# Patient Record
Sex: Male | Born: 1960 | Race: Black or African American | Hispanic: No | Marital: Single | Smoking: Current every day smoker
Health system: Southern US, Community
[De-identification: ages and names within clinical notes are randomized; demographics above are authoritative.]

## PROBLEM LIST (undated history)

## (undated) HISTORY — PX: HERNIA REPAIR: SHX51

---

## 1998-03-01 ENCOUNTER — Emergency Department (HOSPITAL_COMMUNITY): Admission: EM | Admit: 1998-03-01 | Discharge: 1998-03-01 | Payer: Self-pay | Admitting: Emergency Medicine

## 1999-07-03 ENCOUNTER — Emergency Department (HOSPITAL_COMMUNITY): Admission: EM | Admit: 1999-07-03 | Discharge: 1999-07-03 | Payer: Self-pay | Admitting: *Deleted

## 1999-07-27 ENCOUNTER — Emergency Department (HOSPITAL_COMMUNITY): Admission: EM | Admit: 1999-07-27 | Discharge: 1999-07-27 | Payer: Self-pay | Admitting: *Deleted

## 2008-02-28 ENCOUNTER — Emergency Department (HOSPITAL_COMMUNITY): Admission: EM | Admit: 2008-02-28 | Discharge: 2008-02-28 | Payer: Self-pay | Admitting: Emergency Medicine

## 2008-03-09 ENCOUNTER — Emergency Department (HOSPITAL_COMMUNITY): Admission: EM | Admit: 2008-03-09 | Discharge: 2008-03-09 | Payer: Self-pay | Admitting: Family Medicine

## 2008-04-12 ENCOUNTER — Emergency Department (HOSPITAL_COMMUNITY): Admission: EM | Admit: 2008-04-12 | Discharge: 2008-04-12 | Payer: Self-pay | Admitting: Emergency Medicine

## 2014-05-17 ENCOUNTER — Emergency Department (HOSPITAL_COMMUNITY): Payer: Self-pay

## 2014-05-17 ENCOUNTER — Encounter (HOSPITAL_COMMUNITY): Payer: Self-pay | Admitting: Emergency Medicine

## 2014-05-17 ENCOUNTER — Emergency Department (HOSPITAL_COMMUNITY)
Admission: EM | Admit: 2014-05-17 | Discharge: 2014-05-17 | Disposition: A | Discharge status: Home / Self Care | Payer: Self-pay | Attending: Emergency Medicine | Admitting: Emergency Medicine

## 2014-05-17 DIAGNOSIS — Z720 Tobacco use: Secondary | ICD-10-CM | POA: Insufficient documentation

## 2014-05-17 DIAGNOSIS — Z23 Encounter for immunization: Secondary | ICD-10-CM | POA: Insufficient documentation

## 2014-05-17 DIAGNOSIS — Y9289 Other specified places as the place of occurrence of the external cause: Secondary | ICD-10-CM | POA: Insufficient documentation

## 2014-05-17 DIAGNOSIS — S91311A Laceration without foreign body, right foot, initial encounter: Secondary | ICD-10-CM | POA: Insufficient documentation

## 2014-05-17 DIAGNOSIS — Y9389 Activity, other specified: Secondary | ICD-10-CM | POA: Insufficient documentation

## 2014-05-17 DIAGNOSIS — W293XXA Contact with powered garden and outdoor hand tools and machinery, initial encounter: Secondary | ICD-10-CM | POA: Insufficient documentation

## 2014-05-17 MED ORDER — LIDOCAINE-EPINEPHRINE (PF) 2 %-1:200000 IJ SOLN
20.0000 mL | Freq: Once | INTRAMUSCULAR | Status: AC
  Administered 2014-05-17: 10 mL via INTRADERMAL
  Filled 2014-05-17: qty 20

## 2014-05-17 MED ORDER — CEPHALEXIN 500 MG PO CAPS: 500.0000 mg | ORAL_CAPSULE | Freq: Four times a day (QID) | ORAL | Status: AC

## 2014-05-17 MED ORDER — OXYCODONE-ACETAMINOPHEN 5-325 MG PO TABS
1.0000 | ORAL_TABLET | Freq: Once | ORAL | Status: AC
  Administered 2014-05-17: 1 via ORAL
  Filled 2014-05-17: qty 1

## 2014-05-17 MED ORDER — TETANUS-DIPHTH-ACELL PERTUSSIS 5-2.5-18.5 LF-MCG/0.5 IM SUSP
0.5000 mL | Freq: Once | INTRAMUSCULAR | Status: AC
  Administered 2014-05-17: 0.5 mL via INTRAMUSCULAR
  Filled 2014-05-17: qty 0.5

## 2014-05-17 NOTE — ED Notes (Signed)
Patient states cut his R foot with chainsaw.   Top of R foot.   Bleeding controlled and wrapped with gauze in triage.   Patient warm in extremity and has good pulse.

## 2014-05-17 NOTE — ED Notes (Signed)
PA at the bedside attempting to suture patient.

## 2014-05-17 NOTE — ED Provider Notes (Signed)
CSN: [id number]     Arrival date & time 05/17/14  1054 History   First MD Initiated Contact with Patient 05/17/14 1121     Chief Complaint  Patient presents with  . Extremity Laceration     (Consider location/radiation/quality/duration/timing/severity/associated sxs/prior Treatment) HPI Comments: Pt states that he cut the top of his right foot with a chainsaw a short time ago. Pt states that the chainsaw cut thru his boot. Denies problems with movement or sensation. No previous injury to the area. Unsure of last tetanus. Able to ambulate without any problem  The history is provided by the patient. No language interpreter was used.    History reviewed. No pertinent past medical history. Past Surgical History  Procedure Laterality Date  . Hernia repair     No family history on file. History  Substance Use Topics  . Smoking status: Current Every Day Smoker  . Smokeless tobacco: Not on file  . Alcohol Use: Yes    Review of Systems  All other systems reviewed and are negative.     Allergies  Review of patient's allergies indicates no known allergies.  Home Medications   Prior to Admission medications   Medication Sig Start Date End Date Taking? Authorizing Provider  Ranitidine HCl (ZANTAC PO) Take 1 tablet by mouth once as needed (for reflux).   Yes Historical Provider, MD   BP 121/70  Pulse 47  Temp(Src) 98.2 F (36.8 C) (Oral)  Resp 17  Ht 5\' 6"  (1.676 m)  Wt 170 lb (77.111 kg)  BMI 27.45 kg/m2  SpO2 93% Physical Exam  Nursing note and vitals reviewed. Constitutional: He is oriented to person, place, and time. He appears well-developed and well-nourished.  Cardiovascular: Normal rate and regular rhythm.   Pulmonary/Chest: Effort normal and breath sounds normal.  Musculoskeletal: Normal range of motion.  Neurological: He is alert and oriented to person, place, and time. He exhibits normal muscle tone. Coordination normal.  Skin:  Laceration to the top of the  right foot. Pulses intact. Moving toes,foot and ankle without any problem.    ED Course  LACERATION REPAIR Date/Time: 05/17/2014 1:25 PM Performed by: Glendell Docker Authorized by: Glendell Docker Consent: Verbal consent obtained. Risks and benefits: risks, benefits and alternatives were discussed Consent given by: patient Patient identity confirmed: verbally with patient Body area: lower extremity Location details: right foot Laceration length: 5 cm Foreign bodies: no foreign bodies Anesthesia: local infiltration Local anesthetic: lidocaine 2% with epinephrine Irrigation solution: saline Irrigation method: tap Amount of cleaning: standard Skin closure: 3-0 Prolene Number of sutures: 5 Technique: complex and simple Approximation: close Patient tolerance: Patient tolerated the procedure well with no immediate complications.   (including critical care time) Labs Review Labs Reviewed - No data to display  Imaging Review Dg Foot Complete Right  05/17/2014   CLINICAL DATA:  Initial encounter for laceration across the dorsal foot with a chainsaw.  EXAM: RIGHT FOOT COMPLETE - 3+ VIEW  COMPARISON:  None.  FINDINGS: A soft tissue laceration and swelling is noted across the dorsal aspect of the midfoot. There is no underlying fracture or osseous injury. No radiopaque foreign body is evident. The distal foot is intact.  IMPRESSION: Soft tissue laceration and swelling over the dorsal midfoot without an underlying fracture or osseous injury.   Electronically Signed   By: Lawrence Santiago M.D.   On: 05/17/2014 12:03     EKG Interpretation None      MDM   Final diagnoses:  Laceration of  foot, right, initial encounter    Wound closed without any problem. Neurovascularly intact. Discussed wound care with pt. Pt is okay to follow up as needed    Glendell Docker, NP 05/17/14 [place], NP 05/17/14 1334

## 2014-05-17 NOTE — Discharge Instructions (Signed)
Have the sutures removed in 10-14 days Laceration Care, Adult A laceration is a cut that goes through all layers of the skin. The cut goes into the tissue beneath the skin. HOME CARE For stitches (sutures) or staples:  Keep the cut clean and dry.  If you have a bandage (dressing), change it at least once a day. Change the bandage if it gets wet or dirty, or as told by your doctor.  Wash the cut with soap and water 2 times a day. Rinse the cut with water. Pat it dry with a clean towel.  Put a thin layer of medicated cream on the cut as told by your doctor.  You may shower after the first 24 hours. Do not soak the cut in water until the stitches are removed.  Only take medicines as told by your doctor.  Have your stitches or staples removed as told by your doctor. For skin adhesive strips:  Keep the cut clean and dry.  Do not get the strips wet. You may take a bath, but be careful to keep the cut dry.  If the cut gets wet, pat it dry with a clean towel.  The strips will fall off on their own. Do not remove the strips that are still stuck to the cut. For wound glue:  You may shower or take baths. Do not soak or scrub the cut. Do not swim. Avoid heavy sweating until the glue falls off on its own. After a shower or bath, pat the cut dry with a clean towel.  Do not put medicine on your cut until the glue falls off.  If you have a bandage, do not put tape over the glue.  Avoid lots of sunlight or tanning lamps until the glue falls off. Put sunscreen on the cut for the first year to reduce your scar.  The glue will fall off on its own. Do not pick at the glue. You may need a tetanus shot if:  You cannot remember when you had your last tetanus shot.  You have never had a tetanus shot. If you need a tetanus shot and you choose not to have one, you may get tetanus. Sickness from tetanus can be serious. GET HELP RIGHT AWAY IF:   Your pain does not get better with medicine.  Your  arm, hand, leg, or foot loses feeling (numbness) or changes color.  Your cut is bleeding.  Your joint feels weak, or you cannot use your joint.  You have painful lumps on your body.  Your cut is red, puffy (swollen), or painful.  You have a red line on the skin near the cut.  You have yellowish-white fluid (pus) coming from the cut.  You have a fever.  You have a bad smell coming from the cut or bandage.  Your cut breaks open before or after stitches are removed.  You notice something coming out of the cut, such as wood or glass.  You cannot move a finger or toe. MAKE SURE YOU:   Understand these instructions.  Will watch your condition.  Will get help right away if you are not doing well or get worse. Document Released: 01/07/2008 Document Revised: 10/13/2011 Document Reviewed: 01/14/2011 [facility] Patient Information 2015 [place], [place]. This information is not intended to replace advice given to you by your health care provider. Make sure you discuss any questions you have with your health care provider.

## 2014-05-18 NOTE — ED Provider Notes (Signed)
Medical screening examination/treatment/procedure(s) were performed by non-physician practitioner and as supervising physician I was immediately available for consultation/collaboration.   EKG Interpretation None        Merryl Hacker, MD 05/18/14 1737

## 2015-12-06 ENCOUNTER — Encounter (HOSPITAL_COMMUNITY): Payer: Self-pay | Admitting: Emergency Medicine

## 2015-12-06 ENCOUNTER — Emergency Department (HOSPITAL_COMMUNITY)
Admission: EM | Admit: 2015-12-06 | Discharge: 2015-12-06 | Disposition: A | Discharge status: Home / Self Care | Payer: Self-pay | Attending: Emergency Medicine | Admitting: Emergency Medicine

## 2015-12-06 DIAGNOSIS — R040 Epistaxis: Secondary | ICD-10-CM | POA: Insufficient documentation

## 2015-12-06 DIAGNOSIS — F17200 Nicotine dependence, unspecified, uncomplicated: Secondary | ICD-10-CM | POA: Insufficient documentation

## 2015-12-06 DIAGNOSIS — Z792 Long term (current) use of antibiotics: Secondary | ICD-10-CM | POA: Insufficient documentation

## 2015-12-06 MED ORDER — OXYMETAZOLINE HCL 0.05 % NA SOLN
1.0000 | Freq: Once | NASAL | Status: AC
  Administered 2015-12-06: 1 via NASAL
  Filled 2015-12-06: qty 15

## 2015-12-06 NOTE — Discharge Instructions (Signed)
Please read and follow all provided instructions.  Your diagnoses today include:  1. Epistaxis    Tests performed today include:  Vital signs. See below for your results today.   Medications prescribed:   Oxymetazoline - nasal spray for congestion. Do not use for more than 3 days because this medicine can cause rebound congestion.   Home care instructions:  Read the educational materials provided and follow any instructions contained in this packet.  If your nosebleed happens again: Pinch your nose and hold for 15 minutes without letting go.  If this does not stop the bleeding, pinch and hold for another 15 minutes.  If it continues to bleed after this, please come to the Emergency Department or see your family doctor.   Follow-up instructions: Please follow-up with your primary care provider in the next 3 days for further evaluation of your symptoms.   Return instructions:   Please return to the Emergency Department if you experience worsening symptoms.   Please return if you have any other emergent concerns.  Additional Information:  Your vital signs today were: BP 142/76 mmHg   Pulse 68   Temp(Src) 97.8 F (36.6 C) (Oral)   Resp 18   SpO2 97% If your blood pressure (BP) was elevated above 135/85 this visit, please have this repeated by your doctor within one month. --------------

## 2015-12-06 NOTE — ED Notes (Signed)
Pt continues to hold pressure on his nose.

## 2015-12-06 NOTE — ED Provider Notes (Signed)
CSN: [id number]     Arrival date & time 12/06/15  1022 History  By signing my name below, I, Randa Evens, attest that this documentation has been prepared under the direction and in the presence of Carlisle Cater, PA-C. Electronically Signed: Randa Evens, ED Scribe. 12/06/2015. 10:48 AM.     Chief Complaint  Patient presents with  . Epistaxis   Patient is a 55 y.o. male presenting with nosebleeds. The history is provided by the patient. No language interpreter was used.  Epistaxis Associated symptoms: no congestion, no fever, no headaches and no sore throat    HPI Comments: Walter Reid is a 55 y.o. male who presents to the Emergency Department complaining of intermittent bilateral epistaxis onset 1 day prior. Pt states that he had relief from the bleeding last night. Pt states that he has tried packing his nose and applying pressure with no relief. Pt denies being on any anticoagulants. Pt denies injury or trauma. Denies bruising, blood in stool, bledding gums.   History reviewed. No pertinent past medical history. Past Surgical History  Procedure Laterality Date  . Hernia repair     No family history on file. Social History  Substance Use Topics  . Smoking status: Current Every Day Smoker  . Smokeless tobacco: None  . Alcohol Use: Yes    Review of Systems  Constitutional: Negative for fever, chills and fatigue.  HENT: Positive for nosebleeds. Negative for congestion, ear pain, rhinorrhea, sinus pressure and sore throat.   Eyes: Negative for redness.  Respiratory: Negative for shortness of breath.   Gastrointestinal: Negative for nausea, vomiting and blood in stool.  Genitourinary: Negative for dysuria.  Musculoskeletal: Negative for myalgias and neck stiffness.  Skin: Negative for rash.  Neurological: Negative for light-headedness and headaches.  Hematological: Negative for adenopathy. Does not bruise/bleed easily.    Allergies  Review of patient's allergies  indicates no known allergies.  Home Medications   Prior to Admission medications   Medication Sig Start Date End Date Taking? Authorizing Provider  cephALEXin (KEFLEX) 500 MG capsule Take 1 capsule (500 mg total) by mouth 4 (four) times daily. 05/17/14   Glendell Docker, NP  Ranitidine HCl (ZANTAC PO) Take 1 tablet by mouth once as needed (for reflux).    Historical Provider, MD   BP 142/76 mmHg  Pulse 68  Temp(Src) 97.8 F (36.6 C) (Oral)  Resp 18  SpO2 97%   Physical Exam  Constitutional: He appears well-developed and well-nourished. No distress.  HENT:  Head: Normocephalic and atraumatic.  Nose: Mucosal edema present. Epistaxis (R>L) is observed.  No foreign bodies.  Mouth/Throat: Oropharynx is clear and moist.  Eyes: Conjunctivae and EOM are normal. Right eye exhibits no discharge. Left eye exhibits no discharge.  Neck: Normal range of motion. Neck supple. No tracheal deviation present.  Cardiovascular: Normal rate, regular rhythm and normal heart sounds.   Pulmonary/Chest: Effort normal and breath sounds normal. No respiratory distress.  Abdominal: Soft. There is no tenderness.  Musculoskeletal: Normal range of motion.  Neurological: He is alert.  Skin: Skin is warm and dry.  Psychiatric: He has a normal mood and affect. His behavior is normal.  Nursing note and vitals reviewed.   ED Course  Procedures (including critical care time) DIAGNOSTIC STUDIES: Oxygen Saturation is 97% on RA, normal by my interpretation.    COORDINATION OF CARE: 10:54 AM-Discussed treatment plan which includes afrin and applying pressure with pt at bedside and pt agreed to plan.   11:21 AM Patient  held pressure x 20 minutes and released. Bleeding did not resume. Will monitor.  12:01 PM Nosebleed remains controlled. Will discharge to home with bottle of Afrin. Patient instructed to use Afrin and hold for 15 minutes if bleeding resumes. If bleeding continues, hold for another 15 minutes. If  still not controlled, return to ED. Patient verbalizes understanding and agrees with plan. 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  MDM   Final diagnoses:  Epistaxis   Patient with nosebleed, no anticoagulation. No signs of symptomatic anemia. No other signs of bleeding to make me think patient is thrombocytopenic. He appears well. Bleeding controlled after treatment in the ED. Discharged home.  I personally performed the services described in this documentation, which was scribed in my presence. The recorded information has been reviewed and is accurate.      Carlisle Cater, PA-C 12/06/15 [place], MD 12/07/15 [phone]

## 2015-12-06 NOTE — ED Notes (Signed)
Pt instructed by PA to let go of his nose. No bleeding at present.

## 2015-12-06 NOTE — ED Notes (Signed)
No bleeding at present 

## 2015-12-06 NOTE — ED Notes (Addendum)
Pt states nose has been bleeding on and off since yesterday. No trauma. No hx. Pt has NOT been holding pressure on his nose, just has tissue stuffed up his nose. Pt instructed on how to hold pressure.

## 2016-12-15 ENCOUNTER — Emergency Department (HOSPITAL_COMMUNITY)
Admission: EM | Admit: 2016-12-15 | Discharge: 2016-12-15 | Disposition: A | Discharge status: Home / Self Care | Payer: Self-pay | Attending: Emergency Medicine | Admitting: Emergency Medicine

## 2016-12-15 ENCOUNTER — Encounter (HOSPITAL_COMMUNITY): Payer: Self-pay | Admitting: Emergency Medicine

## 2016-12-15 DIAGNOSIS — D171 Benign lipomatous neoplasm of skin and subcutaneous tissue of trunk: Secondary | ICD-10-CM | POA: Insufficient documentation

## 2016-12-15 DIAGNOSIS — F17200 Nicotine dependence, unspecified, uncomplicated: Secondary | ICD-10-CM | POA: Insufficient documentation

## 2016-12-15 NOTE — Discharge Instructions (Signed)
Follow these instructions at home: Keep all follow-up visits as directed by your health care provider. This is important. Contact a health care provider if: Your lipoma becomes larger or hard. Your lipoma becomes painful, red, or increasingly swollen. These could be signs of infection or a more serious condition.

## 2016-12-15 NOTE — ED Provider Notes (Signed)
[facility] DEPT Provider Note    By signing my name below, I, Bea Graff, attest that this documentation has been prepared under the direction and in the presence of Margarita Mail, PA-C. Electronically Signed: Bea Graff, ED Scribe. 12/15/16. 1:21 PM.    History   Chief Complaint Chief Complaint  Patient presents with  . Cyst   The history is provided by the patient and medical records. No language interpreter was used.    Walter Reid is a 56 y.o. male who presents to the Emergency Department complaining of a raised nodule to the left thoracic back that has been present for the past month. He has not done anything for treatment of the area. There are no modifying factors noted. He denies fever, chills, nausea, vomiting, night sweats, weight loss, redness or drainage. He is a smoker.   History reviewed. No pertinent past medical history.  There are no active problems to display for this patient.   Past Surgical History:  Procedure Laterality Date  . HERNIA REPAIR         Home Medications    Prior to Admission medications   Medication Sig Start Date End Date Taking? Authorizing Provider  cephALEXin (KEFLEX) 500 MG capsule Take 1 capsule (500 mg total) by mouth 4 (four) times daily. 05/17/14   Glendell Docker, NP  Ranitidine HCl (ZANTAC PO) Take 1 tablet by mouth once as needed (for reflux).    [provider]    Family History No family history on file.  Social History Social History  Substance Use Topics  . Smoking status: Current Every Day Smoker  . Smokeless tobacco: Not on file  . Alcohol use Yes     Allergies   Patient has no known allergies.   Review of Systems Review of Systems  Constitutional: Negative for chills, diaphoresis, fever and unexpected weight change.  Gastrointestinal: Negative for nausea and vomiting.  Musculoskeletal:       Nodule to left sided thoracic back  Skin: Negative for color change.      Physical Exam Updated Vital Signs BP 137/84   Pulse (!) 51   Temp 99.2 F (37.3 C) (Oral)   Resp 12   SpO2 99%   Physical Exam  Constitutional: He is oriented to person, place, and time. He appears well-developed and well-nourished.  HENT:  Head: Normocephalic and atraumatic.  Neck: Normal range of motion.  Cardiovascular: Normal rate.   Pulmonary/Chest: Effort normal.  Musculoskeletal: Normal range of motion.  10 cm oblong, well demarcated, mobile, soft tissue mass in left lower back. No evidence of infection. Nontender. Not warm or red. Consistent with lipoma.  Neurological: He is alert and oriented to person, place, and time.  Skin: Skin is warm and dry.  Psychiatric: He has a normal mood and affect. His behavior is normal.  Nursing note and vitals reviewed.    ED Treatments / Results  DIAGNOSTIC STUDIES: Oxygen Saturation is 99% on RA, normal by my interpretation.   COORDINATION OF CARE: 1:18 PM- Return precautions discussed. Will give referral to surgeon. Pt verbalizes understanding and agrees to plan.  Medications - No data to display  Labs (all labs ordered are listed, but only abnormal results are displayed) Labs Reviewed - No data to display  EKG  EKG Interpretation None       Radiology No results found.  Procedures Procedures (including critical care time)  Medications Ordered in ED Medications - No data to display   Initial Impression / Assessment  and Plan / ED Course  I have reviewed the triage vital signs and the nursing notes.  Pertinent labs & imaging results that were available during my care of the patient were reviewed by me and considered in my medical decision making (see chart for details).     This appears to be a lipoma. I no concern for infection. I have given the patient a referral to [facility]. The patient appears safe for discharge. Return precautions discussed.  Final Clinical Impressions(s) / ED Diagnoses   Final  diagnoses:  Lipoma of back    New Prescriptions New Prescriptions   No medications on file    I personally performed the services described in this documentation, which was scribed in my presence. The recorded information has been reviewed and is accurate.        Margarita Mail, PA-C 12/16/16 [place], Ankit, MD 12/19/16 [phone]

## 2016-12-15 NOTE — ED Triage Notes (Signed)
Pt has soft, raised lump to left side of his back, reports lesion has been present x1 month, has increased in size since first appearing.

## 2018-06-26 ENCOUNTER — Emergency Department (HOSPITAL_COMMUNITY): Payer: No Typology Code available for payment source

## 2018-06-26 ENCOUNTER — Other Ambulatory Visit: Payer: Self-pay

## 2018-06-26 ENCOUNTER — Encounter (HOSPITAL_COMMUNITY): Payer: Self-pay | Admitting: Obstetrics and Gynecology

## 2018-06-26 ENCOUNTER — Emergency Department (HOSPITAL_COMMUNITY)
Admission: EM | Admit: 2018-06-26 | Discharge: 2018-06-26 | Disposition: A | Discharge status: Home / Self Care | Payer: No Typology Code available for payment source | Attending: Emergency Medicine | Admitting: Emergency Medicine

## 2018-06-26 DIAGNOSIS — Y929 Unspecified place or not applicable: Secondary | ICD-10-CM | POA: Diagnosis not present

## 2018-06-26 DIAGNOSIS — M542 Cervicalgia: Secondary | ICD-10-CM | POA: Diagnosis present

## 2018-06-26 DIAGNOSIS — F17210 Nicotine dependence, cigarettes, uncomplicated: Secondary | ICD-10-CM | POA: Diagnosis not present

## 2018-06-26 DIAGNOSIS — S161XXA Strain of muscle, fascia and tendon at neck level, initial encounter: Secondary | ICD-10-CM | POA: Diagnosis not present

## 2018-06-26 DIAGNOSIS — Y999 Unspecified external cause status: Secondary | ICD-10-CM | POA: Insufficient documentation

## 2018-06-26 DIAGNOSIS — Y939 Activity, unspecified: Secondary | ICD-10-CM | POA: Diagnosis not present

## 2018-06-26 MED ORDER — IBUPROFEN 200 MG PO TABS
600.0000 mg | ORAL_TABLET | Freq: Once | ORAL | Status: AC
  Administered 2018-06-26: 600 mg via ORAL
  Filled 2018-06-26: qty 3

## 2018-06-26 MED ORDER — METHOCARBAMOL 500 MG PO TABS: 500.0000 mg | ORAL_TABLET | Freq: Two times a day (BID) | ORAL | 0 refills | Status: AC
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
## 2018-06-26 NOTE — Discharge Instructions (Signed)
Do not drive while taking the muscle relaxer because it will make you sleepy. Follow up with your doctor next week and have them recheck your blood pressure. Take tylenol in addition to the muscle relaxer if needed for pain. Return here as needed.
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
## 2018-06-26 NOTE — ED Notes (Signed)
Bed: WTR7 Expected date:  Expected time:  Means of arrival:  Comments: 

## 2019-03-27 ENCOUNTER — Encounter (HOSPITAL_COMMUNITY): Payer: Self-pay | Admitting: *Deleted

## 2019-03-27 ENCOUNTER — Emergency Department (HOSPITAL_COMMUNITY)
Admission: EM | Admit: 2019-03-27 | Discharge: 2019-03-28 | Disposition: A | Discharge status: Home / Self Care | Payer: Self-pay | Attending: Emergency Medicine | Admitting: Emergency Medicine

## 2019-03-27 ENCOUNTER — Other Ambulatory Visit: Payer: Self-pay

## 2019-03-27 DIAGNOSIS — R0789 Other chest pain: Secondary | ICD-10-CM | POA: Insufficient documentation

## 2019-03-27 DIAGNOSIS — R079 Chest pain, unspecified: Secondary | ICD-10-CM

## 2019-03-27 DIAGNOSIS — F17210 Nicotine dependence, cigarettes, uncomplicated: Secondary | ICD-10-CM | POA: Insufficient documentation

## 2019-03-27 NOTE — ED Triage Notes (Signed)
Pt reports constant "sharp" chest pain radiating to the neck and shoulders since last night, associated with SOB.

## 2019-03-28 ENCOUNTER — Emergency Department (HOSPITAL_COMMUNITY): Payer: Self-pay

## 2019-03-28 LAB — CBC
HCT: 46.7 % (ref 39.0–52.0)
Hemoglobin: 15.6 g/dL (ref 13.0–17.0)
MCH: 31.1 pg (ref 26.0–34.0)
MCHC: 33.4 g/dL (ref 30.0–36.0)
MCV: 93 fL (ref 80.0–100.0)
Platelets: 308 10*3/uL (ref 150–400)
RBC: 5.02 MIL/uL (ref 4.22–5.81)
RDW: 13.4 % (ref 11.5–15.5)
WBC: 8.5 10*3/uL (ref 4.0–10.5)
nRBC: 0 % (ref 0.0–0.2)

## 2019-03-28 LAB — BASIC METABOLIC PANEL
Anion gap: 11 (ref 5–15)
BUN: 14 mg/dL (ref 6–20)
CO2: 23 mmol/L (ref 22–32)
Calcium: 9.4 mg/dL (ref 8.9–10.3)
Chloride: 101 mmol/L (ref 98–111)
Creatinine, Ser: 1 mg/dL (ref 0.61–1.24)
GFR calc Af Amer: >60 mL/min (ref >60)
GFR calc non Af Amer: >60 mL/min (ref >60)
Glucose, Bld: 119 mg/dL — ABNORMAL HIGH (ref 70–99)
Potassium: 3.8 mmol/L (ref 3.5–5.1)
Sodium: 135 mmol/L (ref 135–145)

## 2019-03-28 LAB — TROPONIN I (HIGH SENSITIVITY)
Troponin I (High Sensitivity): 5 ng/L (ref <18)
Troponin I (High Sensitivity): 4 ng/L (ref <18)

## 2019-03-28 MED ORDER — ASPIRIN 81 MG PO CHEW
324.0000 mg | CHEWABLE_TABLET | Freq: Once | ORAL | Status: AC
  Administered 2019-03-28: 324 mg via ORAL
  Filled 2019-03-28: qty 4

## 2019-03-28 MED ORDER — MORPHINE SULFATE (PF) 4 MG/ML IV SOLN
4.0000 mg | Freq: Once | INTRAVENOUS | Status: AC
  Administered 2019-03-28: 4 mg via INTRAVENOUS
  Filled 2019-03-28: qty 1

## 2019-03-28 MED ORDER — SODIUM CHLORIDE 0.9% FLUSH: 3.0000 mL | Freq: Once | INTRAVENOUS | Status: DC

## 2019-03-28 MED ORDER — IBUPROFEN 800 MG PO TABS: 800.0000 mg | ORAL_TABLET | Freq: Three times a day (TID) | ORAL | 0 refills | Status: AC

## 2019-03-28 NOTE — ED Provider Notes (Signed)
[facility] EMERGENCY DEPARTMENT Provider Note   CSN: [id number] Arrival date & time: 03/27/19  2343     History   Chief Complaint Chief Complaint  Patient presents with  . Chest Pain    HPI Walter Reid is a 58 y.o. male.     Patient presents to the emergency department with a chief complaint of right-sided chest pain.  He states he has been having constant chest pain for the past 3 days.  He states the pain is sharp.  States that it radiates to his shoulders.  He reports some associated shortness of breath.  Denies any fever, chills, cough.  Nuys any diaphoresis.  Denies any leg swelling or pain.  The history is provided by the patient. No language interpreter was used.    History reviewed. No pertinent past medical history.  There are no active problems to display for this patient.   Past Surgical History:  Procedure Laterality Date  . HERNIA REPAIR          Home Medications    Prior to Admission medications   Medication Sig Start Date End Date Taking? Authorizing Provider  cephALEXin (KEFLEX) 500 MG capsule Take 1 capsule (500 mg total) by mouth 4 (four) times daily. 05/17/14   Glendell Docker, NP  methocarbamol (ROBAXIN) 500 MG tablet Take 1 tablet (500 mg total) by mouth 2 (two) times daily. 06/26/18   Ashley Murrain, NP  Ranitidine HCl (ZANTAC PO) Take 1 tablet by mouth once as needed (for reflux).    [provider]    Family History No family history on file.  Social History Social History   Tobacco Use  . Smoking status: Current Every Day Smoker    Packs/day: 0.50    Years: 15.00    Pack years: 7.50    Types: Cigarettes  Substance Use Topics  . Alcohol use: Yes  . Drug use: No     Allergies   Patient has no known allergies.   Review of Systems Review of Systems  All other systems reviewed and are negative.    Physical Exam Updated Vital Signs BP (!) 178/98 (BP Location: Right Arm)   Pulse (!) 53    Temp 98.4 F (36.9 C) (Oral)   Resp (!) 24   SpO2 96%   Physical Exam Vitals signs and nursing note reviewed.  Constitutional:      Appearance: He is well-developed.  HENT:     Head: Normocephalic and atraumatic.  Eyes:     Conjunctiva/sclera: Conjunctivae normal.  Neck:     Musculoskeletal: Neck supple.  Cardiovascular:     Rate and Rhythm: Normal rate and regular rhythm.     Heart sounds: No murmur.  Pulmonary:     Effort: Pulmonary effort is normal. No respiratory distress.     Breath sounds: Normal breath sounds.  Abdominal:     Palpations: Abdomen is soft.     Tenderness: There is no abdominal tenderness.  Musculoskeletal: Normal range of motion.  Skin:    General: Skin is warm and dry.  Neurological:     Mental Status: He is alert and oriented to person, place, and time.  Psychiatric:        Mood and Affect: Mood normal.        Behavior: Behavior normal.      ED Treatments / Results  Labs (all labs ordered are listed, but only abnormal results are displayed) Labs Reviewed  BASIC METABOLIC PANEL - Abnormal;  Notable for the following components:      Result Value   Glucose, Bld 119 (*)    All other components within normal limits  CBC  TROPONIN I (HIGH SENSITIVITY)  TROPONIN I (HIGH SENSITIVITY)    EKG EKG Interpretation  Date/Time:  Sunday March 27 2019 23:49:32 EDT Ventricular Rate:  60 PR Interval:  152 QRS Duration: 104 QT Interval:  432 QTC Calculation: 432 R Axis:   33 Text Interpretation:  Normal sinus rhythm ST elevation consider inferior injury or acute infarct No significant change since last tracing Confirmed by Wickline, Donald ([id number]) on 03/27/2019 11:54:54 PM   Radiology Dg Chest 2 View  Result Date: 03/28/2019 CLINICAL DATA:  Chest pain EXAM: CHEST - 2 VIEW COMPARISON:  None. FINDINGS: The heart size and mediastinal contours are within normal limits. Both lungs are clear. Age indeterminate mild wedging of mid to lower thoracic  vertebra. IMPRESSION: No active cardiopulmonary disease. Electronically Signed   By: Kim  Fujinaga M.D.   On: 03/28/2019 00:46    Procedures Procedures (including critical care time)  Medications Ordered in ED Medications  sodium chloride flush (NS) 0.9 % injection 3 mL (has no administration in time range)     Initial Impression / Assessment and Plan / ED Course  I have reviewed the triage vital signs and the nursing notes.  Pertinent labs & imaging results that were available during my care of the patient were reviewed by me and considered in my medical decision making (see chart for details).       Patient with chest pain x3 days.  Pain is been constant.  He describes it as a sharp pain.  He reports some pain at the [place].  Reports having some radiating pain to his shoulders.  Denies any cough or fever.  Denies any heart problems in the past.  He is a smoker.  He does not take any medications on a daily basis.  HEART score is 3.  EKGs unchanged from the past  Repeat troponin without increase.  Doubt PE, not hypoxic nor tachycardic.  Doubt ACS given length of symptoms.  Could be chest wall pain.  Will treat with NSAIDs on outpatient basis and recommend cards follow-up.  Return precautions given.   Final Clinical Impressions(s) / ED Diagnoses   Final diagnoses:  Nonspecific chest pain    ED Discharge Orders         Ordered    ibuprofen (ADVIL) 800 MG tablet  3 times daily     08 /24/20 0622           Montine Circle, PA-C 03/28/19 [id number]    Ripley Fraise, MD 03/28/19 [phone]

## 2019-03-28 NOTE — ED Notes (Signed)
Walter Reid pts wife calling to check on pt status

## 2019-06-08 ENCOUNTER — Encounter: Payer: Self-pay | Admitting: General Practice

## 2020-04-22 ENCOUNTER — Emergency Department (HOSPITAL_BASED_OUTPATIENT_CLINIC_OR_DEPARTMENT_OTHER)
Admission: EM | Admit: 2020-04-22 | Discharge: 2020-04-23 | Disposition: A | Discharge status: Home / Self Care | Payer: HRSA Program | Attending: Emergency Medicine | Admitting: Emergency Medicine

## 2020-04-22 ENCOUNTER — Encounter (HOSPITAL_BASED_OUTPATIENT_CLINIC_OR_DEPARTMENT_OTHER): Payer: Self-pay | Admitting: Emergency Medicine

## 2020-04-22 ENCOUNTER — Other Ambulatory Visit: Payer: Self-pay

## 2020-04-22 DIAGNOSIS — R509 Fever, unspecified: Secondary | ICD-10-CM | POA: Diagnosis present

## 2020-04-22 DIAGNOSIS — R439 Unspecified disturbances of smell and taste: Secondary | ICD-10-CM | POA: Diagnosis not present

## 2020-04-22 DIAGNOSIS — Z5321 Procedure and treatment not carried out due to patient leaving prior to being seen by health care provider: Secondary | ICD-10-CM | POA: Diagnosis not present

## 2020-04-22 DIAGNOSIS — U071 COVID-19: Secondary | ICD-10-CM | POA: Insufficient documentation

## 2020-04-22 LAB — SARS CORONAVIRUS 2 BY RT PCR (HOSPITAL ORDER, PERFORMED IN ~~LOC~~ HOSPITAL LAB): SARS Coronavirus 2: POSITIVE — AB

## 2020-04-22 MED ORDER — ACETAMINOPHEN 325 MG PO TABS
650.0000 mg | ORAL_TABLET | Freq: Once | ORAL | Status: AC | PRN
  Administered 2020-04-22: 650 mg via ORAL
  Filled 2020-04-22: qty 2

## 2020-04-22 NOTE — ED Triage Notes (Addendum)
Requesting covid test. States girlfriend has covid and he has s/s for about a week. "feels bad sometimes." also reports "my taste is messed up" [place]. Fever in triage. Unvaccinated.

## 2020-04-22 NOTE — ED Notes (Signed)
Did not answer when called by the tech times 3.

## 2020-04-22 NOTE — ED Notes (Signed)
Dr. Laverta Baltimore aware of the positive covid result

## 2020-04-23 ENCOUNTER — Telehealth: Payer: Self-pay | Admitting: Nurse Practitioner

## 2020-04-23 DIAGNOSIS — U071 COVID-19: Secondary | ICD-10-CM

## 2020-04-23 NOTE — Telephone Encounter (Signed)
Called to Discuss with patient about Covid symptoms and the use of regeneron, a monoclonal antibody infusion for those with mild to moderate Covid symptoms and at a high risk of hospitalization.     Pt is qualified for this infusion at the [facility] center due to co-morbid conditions and/or a member of an at-risk group.     Unable to reach pt. Left message to return call. Sent text message as well.   Beckey Rutter, [place], AGNP-C [phone] ([place])

## 2020-07-09 ENCOUNTER — Ambulatory Visit: Payer: Self-pay | Admitting: Internal Medicine

## 2020-10-01 ENCOUNTER — Ambulatory Visit: Payer: Self-pay | Admitting: Internal Medicine

## 2021-02-18 ENCOUNTER — Other Ambulatory Visit: Payer: Self-pay

## 2021-02-18 ENCOUNTER — Emergency Department (HOSPITAL_COMMUNITY): Payer: No Typology Code available for payment source

## 2021-02-18 ENCOUNTER — Emergency Department (HOSPITAL_COMMUNITY)
Admission: EM | Admit: 2021-02-18 | Discharge: 2021-02-18 | Disposition: A | Discharge status: Home / Self Care | Payer: No Typology Code available for payment source | Attending: Emergency Medicine | Admitting: Emergency Medicine

## 2021-02-18 DIAGNOSIS — M542 Cervicalgia: Secondary | ICD-10-CM | POA: Diagnosis present

## 2021-02-18 DIAGNOSIS — Y92410 Unspecified street and highway as the place of occurrence of the external cause: Secondary | ICD-10-CM | POA: Diagnosis not present

## 2021-02-18 DIAGNOSIS — F17210 Nicotine dependence, cigarettes, uncomplicated: Secondary | ICD-10-CM | POA: Diagnosis not present

## 2021-02-18 DIAGNOSIS — S46812A Strain of other muscles, fascia and tendons at shoulder and upper arm level, left arm, initial encounter: Secondary | ICD-10-CM

## 2021-02-18 DIAGNOSIS — M5412 Radiculopathy, cervical region: Secondary | ICD-10-CM | POA: Insufficient documentation

## 2021-02-18 MED ORDER — KETOROLAC TROMETHAMINE 60 MG/2ML IM SOLN
15.0000 mg | Freq: Once | INTRAMUSCULAR | Status: AC
  Administered 2021-02-18: 15 mg via INTRAMUSCULAR
  Filled 2021-02-18: qty 2
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
## 2021-02-18 NOTE — Discharge Instructions (Addendum)
Please be aware that your neck and shoulder pain may worsen tomorrow and you may feel more sore than you do today. This is normal and will take some time to get back to normal. Please return to the ED if you develop a sudden headache, severe nausea or vomiting, are unable to walk or move, or significant worsening of your symptoms.
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
## 2021-02-22 ENCOUNTER — Ambulatory Visit: Payer: Self-pay | Admitting: Internal Medicine

## 2021-12-04 ENCOUNTER — Emergency Department (HOSPITAL_BASED_OUTPATIENT_CLINIC_OR_DEPARTMENT_OTHER): Payer: Commercial Managed Care - HMO

## 2021-12-04 ENCOUNTER — Other Ambulatory Visit: Payer: Self-pay

## 2021-12-04 ENCOUNTER — Encounter (HOSPITAL_BASED_OUTPATIENT_CLINIC_OR_DEPARTMENT_OTHER): Payer: Self-pay | Admitting: Emergency Medicine

## 2021-12-04 ENCOUNTER — Emergency Department (HOSPITAL_BASED_OUTPATIENT_CLINIC_OR_DEPARTMENT_OTHER)
Admission: EM | Admit: 2021-12-04 | Discharge: 2021-12-04 | Disposition: A | Discharge status: Home / Self Care | Payer: Commercial Managed Care - HMO | Attending: Emergency Medicine | Admitting: Emergency Medicine

## 2021-12-04 DIAGNOSIS — R030 Elevated blood-pressure reading, without diagnosis of hypertension: Secondary | ICD-10-CM | POA: Diagnosis not present

## 2021-12-04 DIAGNOSIS — J209 Acute bronchitis, unspecified: Secondary | ICD-10-CM | POA: Diagnosis not present

## 2021-12-04 DIAGNOSIS — R0602 Shortness of breath: Secondary | ICD-10-CM | POA: Diagnosis present

## 2021-12-04 LAB — CBC WITH DIFFERENTIAL/PLATELET
Abs Immature Granulocytes: 0.02 10*3/uL (ref 0.00–0.07)
Basophils Absolute: 0 10*3/uL (ref 0.0–0.1)
Basophils Relative: 0 %
Eosinophils Absolute: 0.2 10*3/uL (ref 0.0–0.5)
Eosinophils Relative: 2 %
HCT: 45 % (ref 39.0–52.0)
Hemoglobin: 15.2 g/dL (ref 13.0–17.0)
Immature Granulocytes: 0 %
Lymphocytes Relative: 22 %
Lymphs Abs: 2.3 10*3/uL (ref 0.7–4.0)
MCH: 30.6 pg (ref 26.0–34.0)
MCHC: 33.8 g/dL (ref 30.0–36.0)
MCV: 90.7 fL (ref 80.0–100.0)
Monocytes Absolute: 0.8 10*3/uL (ref 0.1–1.0)
Monocytes Relative: 7 %
Neutro Abs: 7.3 10*3/uL (ref 1.7–7.7)
Neutrophils Relative %: 69 %
Platelets: 282 10*3/uL (ref 150–400)
RBC: 4.96 MIL/uL (ref 4.22–5.81)
RDW: 13.3 % (ref 11.5–15.5)
WBC: 10.6 10*3/uL — ABNORMAL HIGH (ref 4.0–10.5)
nRBC: 0 % (ref 0.0–0.2)

## 2021-12-04 LAB — BRAIN NATRIURETIC PEPTIDE: B Natriuretic Peptide: 12.9 pg/mL (ref 0.0–100.0)

## 2021-12-04 LAB — BASIC METABOLIC PANEL
Anion gap: 9 (ref 5–15)
BUN: 9 mg/dL (ref 8–23)
CO2: 23 mmol/L (ref 22–32)
Calcium: 9.1 mg/dL (ref 8.9–10.3)
Chloride: 101 mmol/L (ref 98–111)
Creatinine, Ser: 0.73 mg/dL (ref 0.61–1.24)
GFR, Estimated: >60 mL/min (ref >60)
Glucose, Bld: 157 mg/dL — ABNORMAL HIGH (ref 70–99)
Potassium: 3.5 mmol/L (ref 3.5–5.1)
Sodium: 133 mmol/L — ABNORMAL LOW (ref 135–145)

## 2021-12-04 LAB — TROPONIN I (HIGH SENSITIVITY): Troponin I (High Sensitivity): 4 ng/L (ref <18)

## 2021-12-04 MED ORDER — ALBUTEROL SULFATE HFA 108 (90 BASE) MCG/ACT IN AERS
1.0000 | INHALATION_SPRAY | Freq: Once | RESPIRATORY_TRACT | Status: AC
  Administered 2021-12-04: 1 via RESPIRATORY_TRACT
  Filled 2021-12-04: qty 6.7

## 2021-12-04 MED ORDER — AZITHROMYCIN 250 MG PO TABS
500.0000 mg | ORAL_TABLET | Freq: Once | ORAL | Status: AC
  Administered 2021-12-04: 500 mg via ORAL
  Filled 2021-12-04: qty 2

## 2021-12-04 MED ORDER — PREDNISONE 10 MG PO TABS: 40.0000 mg | ORAL_TABLET | Freq: Every day | ORAL | 0 refills | Status: DC

## 2021-12-04 MED ORDER — ALBUTEROL SULFATE (2.5 MG/3ML) 0.083% IN NEBU
5.0000 mg | INHALATION_SOLUTION | Freq: Once | RESPIRATORY_TRACT | Status: AC
  Administered 2021-12-04: 5 mg via RESPIRATORY_TRACT
  Filled 2021-12-04: qty 6

## 2021-12-04 MED ORDER — PREDNISONE 20 MG PO TABS
40.0000 mg | ORAL_TABLET | Freq: Once | ORAL | Status: AC
  Administered 2021-12-04: 40 mg via ORAL
  Filled 2021-12-04: qty 2

## 2021-12-04 MED ORDER — AZITHROMYCIN 250 MG PO TABS: 250.0000 mg | ORAL_TABLET | Freq: Every day | ORAL | 0 refills | Status: DC

## 2021-12-04 MED ORDER — ALBUTEROL SULFATE HFA 108 (90 BASE) MCG/ACT IN AERS: 2.0000 | INHALATION_SPRAY | RESPIRATORY_TRACT | 0 refills | Status: DC | PRN

## 2021-12-04 NOTE — ED Triage Notes (Signed)
Pt c/o pain across chest with shob x 2 weeks. Pt endorses cough and congestion.  ?

## 2021-12-04 NOTE — Progress Notes (Signed)
Patient ambulated around emergency department while on room air.  SpO2 stayed above 94% and heart rate stayed near 88bpm throughout the trip.  Patient work of breathing within normal limits upon returning to the room. ?

## 2021-12-04 NOTE — ED Provider Notes (Signed)
?[facility] EMERGENC[facility] DEPARTMENT ?Provider Note ? ? ?CSN: [id number] ?Arrival date & time: 12/04/21  0359 ? ?  ? ?History ? ?Chief Complaint  ?Patient presents with  ? Chest Pain  ? ? ?FALLOU HULBERT is a 61 y.o. male. ? ?The history is provided by the patient and medical records.  ?Chest Pain ?MASIN SHATTO is a 61 y.o. male who presents to the Emergency Department complaining of shortness of breath.  He presents emergency department accompanied by his significant other for evaluation of 2 weeks of shortness of breath.  He reports symptoms are worse at night and he has associated cough productive of green sputum and chest discomfort when coughing.  He feels a little short of breath.  No fever, vomiting, diarrhea, leg swelling or pain.  He has no known medical problems and takes no medications.  He has been using over-the-counter Coricidin as well as cough drops without any significant relief of his symptoms.  He does not go to the doctor routinely and does not get regular checkups.  He did take a COVID test at home, which was negative.  He smokes 1 pack/day.  Occasional alcohol, no drug use. ?  ? ?Home Medications ?Prior to Admission medications   ?Medication Sig Start Date End Date Taking? Authorizing Provider  ?albuterol (VENTOLIN HFA) 108 (90 Base) MCG/ACT inhaler Inhale 2 puffs into the lungs every 4 (four) hours as needed for wheezing or shortness of breath. 12/04/21  Yes Quintella Reichert, MD  ?azithromycin (ZITHROMAX) 250 MG tablet Take 1 tablet (250 mg total) by mouth daily. Take one tablet by mouth daily starting 5/4 12/04/21  Yes Quintella Reichert, MD  ?predniSONE (DELTASONE) 10 MG tablet Take 4 tablets (40 mg total) by mouth daily. 12/04/21  Yes Quintella Reichert, MD  ?cephALEXin (KEFLEX) 500 MG capsule Take 1 capsule (500 mg total) by mouth 4 (four) times daily. 05/17/14   Glendell Docker, NP  ?ibuprofen (ADVIL) 800 MG tablet Take 1 tablet (800 mg total) by mouth 3 (three) times daily. 03/28/19    Montine Circle, PA-C  ?methocarbamol (ROBAXIN) 500 MG tablet Take 1 tablet (500 mg total) by mouth 2 (two) times daily. 06/26/18   Ashley Murrain, NP  ?Ranitidine HCl (ZANTAC PO) Take 1 tablet by mouth once as needed (for reflux).    [provider]  ?   ? ?Allergies    ?Patient has no known allergies.   ? ?Review of Systems   ?Review of Systems  ?Cardiovascular:  Positive for chest pain.  ?All other systems reviewed and are negative. ? ?Physical Exam ?Updated Vital Signs ?BP (!) 172/87   Pulse 69   Temp 98.6 ?F (37 ?C) (Oral)   Resp (!) 22   Ht '5\' 7"'$  (1.702 m)   Wt 77.1 kg   SpO2 94%   BMI 26.63 kg/m?  ?Physical Exam ?Vitals and nursing note reviewed.  ?Constitutional:   ?   Appearance: He is well-developed.  ?HENT:  ?   Head: Normocephalic and atraumatic.  ?Cardiovascular:  ?   Rate and Rhythm: Normal rate and regular rhythm.  ?   Heart sounds: No murmur heard. ?Pulmonary:  ?   Effort: Pulmonary effort is normal. No respiratory distress.  ?   Breath sounds: Normal breath sounds.  ?Abdominal:  ?   Palpations: Abdomen is soft.  ?   Tenderness: There is no abdominal tenderness. There is no guarding or rebound.  ?Musculoskeletal:     ?   General: No  swelling or tenderness.  ?   Comments: 2+ DP pulses bilaterally  ?Skin: ?   General: Skin is warm and dry.  ?Neurological:  ?   Mental Status: He is alert and oriented to person, place, and time.  ?Psychiatric:     ?   Behavior: Behavior normal.  ? ? ?ED Results / Procedures / Treatments   ?Labs ?(all labs ordered are listed, but only abnormal results are displayed) ?Labs Reviewed  ?BASIC METABOLIC PANEL - Abnormal; Notable for the following components:  ?    Result Value  ? Sodium 133 (*)   ? Glucose, Bld 157 (*)   ? All other components within normal limits  ?CBC WITH DIFFERENTIAL/PLATELET - Abnormal; Notable for the following components:  ? WBC 10.6 (*)   ? All other components within normal limits  ?BRAIN NATRIURETIC PEPTIDE  ?TROPONIN I (HIGH  SENSITIVITY)  ? ? ?EKG ?EKG Interpretation ? ?Date/Time:  Wednesday Dec 04 2021 04:05:03 EDT ?Ventricular Rate:  70 ?PR Interval:  163 ?QRS Duration: 105 ?QT Interval:  411 ?QTC Calculation: 444 ?R Axis:   -74 ?Text Interpretation: Sinus rhythm LAD, consider left anterior fascicular block Confirmed by Quintella Reichert [phone]) on 12/04/2021 4:07:11 AM ? ?Radiology ?DG Chest 2 View ? ?Result Date: 12/04/2021 ?CLINICAL DATA:  Shortness of breath. Pain across the chest for 2 weeks with cough and congestion EXAM: CHEST - 2 VIEW COMPARISON:  03/28/2019 FINDINGS: Interstitial coarsening which is chronic and may be smoking related given the chart history instability. There is no edema, consolidation, effusion, or pneumothorax. Normal heart size and mediastinal contours. Chronic midthoracic vertebral wedging, also seen in 2020. IMPRESSION: Chronic bronchitic markings. No acute finding when compared to prior. Electronically Signed   By: Jorje Guild M.D.   On: 12/04/2021 04:53   ? ?Procedures ?Procedures  ? ? ?Medications Ordered in ED ?Medications  ?albuterol (VENTOLIN HFA) 108 (90 Base) MCG/ACT inhaler 1 puff (has no administration in time range)  ?albuterol (PROVENTIL) (2.5 MG/3ML) 0.083% nebulizer solution 5 mg (5 mg Nebulization Given 12/04/21 0423)  ?albuterol (PROVENTIL) (2.5 MG/3ML) 0.083% nebulizer solution 5 mg (5 mg Nebulization Given 12/04/21 0533)  ?predniSONE (DELTASONE) tablet 40 mg (40 mg Oral Given 12/04/21 0551)  ?azithromycin (ZITHROMAX) tablet 500 mg (500 mg Oral Given 12/04/21 0551)  ? ? ?ED Course/ Medical Decision Making/ A&P ?  ?                        ?Medical Decision Making ?Amount and/or Complexity of Data Reviewed ?Labs: ordered. ?Radiology: ordered. ? ?Risk ?Prescription drug management. ? ? ?Patient with no significant medical history here for evaluation of 2 weeks of cough, difficulty breathing.  Patient with good air movement bilaterally on initial evaluation.  He was treated with empiric trial of  albuterol with improvement in his symptoms.  Repeat lung exam is essentially stable.  Patient was treated with additional treatment and continued to feel improved compared to previously.  He is able to ambulate the department without difficulty or hypoxia.  Chest x-ray with chronic changes, images personally reviewed.  Troponin, BNP are within normal limits.  CBC without anemia.  BMP with mild hyponatremia.  Current clinical picture is not consistent with PE, ACS, acute CHF, hypertensive urgency.  Will treat with steroid burst, antibiotics, as needed albuterol for likely bronchitis, possible COPD.  Discussed with patient importance of establishing primary care provider as well as return precautions. ? ? ? ? ? ? ? ?Final  Clinical Impression(s) / ED Diagnoses ?Final diagnoses:  ?Acute bronchitis, unspecified organism  ?Elevated blood pressure reading in office without diagnosis of hypertension  ? ? ?Rx / DC Orders ?ED Discharge Orders   ? ?      Ordered  ?  predniSONE (DELTASONE) 10 MG tablet  Daily       ? 12/04/21 0616  ?  azithromycin (ZITHROMAX) 250 MG tablet  Daily       ? 12/04/21 0616  ?  albuterol (VENTOLIN HFA) 108 (90 Base) MCG/ACT inhaler  Every 4 hours PRN       ? 12/04/21 0616  ? ?  ?  ? ?  ? ? ?  ?Quintella Reichert, MD ?12/04/21 4536 ? ?

## 2022-04-13 ENCOUNTER — Encounter (HOSPITAL_BASED_OUTPATIENT_CLINIC_OR_DEPARTMENT_OTHER): Payer: Self-pay | Admitting: Emergency Medicine

## 2022-04-13 ENCOUNTER — Emergency Department (HOSPITAL_BASED_OUTPATIENT_CLINIC_OR_DEPARTMENT_OTHER)
Admission: EM | Admit: 2022-04-13 | Discharge: 2022-04-13 | Disposition: A | Discharge status: Home / Self Care | Payer: Commercial Managed Care - HMO | Attending: Emergency Medicine | Admitting: Emergency Medicine

## 2022-04-13 ENCOUNTER — Emergency Department (HOSPITAL_BASED_OUTPATIENT_CLINIC_OR_DEPARTMENT_OTHER): Payer: Commercial Managed Care - HMO

## 2022-04-13 ENCOUNTER — Other Ambulatory Visit: Payer: Self-pay

## 2022-04-13 DIAGNOSIS — R0602 Shortness of breath: Secondary | ICD-10-CM | POA: Diagnosis present

## 2022-04-13 DIAGNOSIS — Z7952 Long term (current) use of systemic steroids: Secondary | ICD-10-CM | POA: Diagnosis not present

## 2022-04-13 DIAGNOSIS — Z7951 Long term (current) use of inhaled steroids: Secondary | ICD-10-CM | POA: Insufficient documentation

## 2022-04-13 DIAGNOSIS — J441 Chronic obstructive pulmonary disease with (acute) exacerbation: Secondary | ICD-10-CM | POA: Diagnosis not present

## 2022-04-13 DIAGNOSIS — F17210 Nicotine dependence, cigarettes, uncomplicated: Secondary | ICD-10-CM | POA: Diagnosis not present

## 2022-04-13 LAB — BASIC METABOLIC PANEL
Anion gap: 8 (ref 5–15)
BUN: 10 mg/dL (ref 8–23)
CO2: 24 mmol/L (ref 22–32)
Calcium: 9.4 mg/dL (ref 8.9–10.3)
Chloride: 106 mmol/L (ref 98–111)
Creatinine, Ser: 0.88 mg/dL (ref 0.61–1.24)
GFR, Estimated: >60 mL/min (ref >60)
Glucose, Bld: 115 mg/dL — ABNORMAL HIGH (ref 70–99)
Potassium: 3.8 mmol/L (ref 3.5–5.1)
Sodium: 138 mmol/L (ref 135–145)

## 2022-04-13 LAB — CBC
HCT: 47.8 % (ref 39.0–52.0)
Hemoglobin: 15.8 g/dL (ref 13.0–17.0)
MCH: 30.6 pg (ref 26.0–34.0)
MCHC: 33.1 g/dL (ref 30.0–36.0)
MCV: 92.6 fL (ref 80.0–100.0)
Platelets: 300 10*3/uL (ref 150–400)
RBC: 5.16 MIL/uL (ref 4.22–5.81)
RDW: 13.6 % (ref 11.5–15.5)
WBC: 6.8 10*3/uL (ref 4.0–10.5)
nRBC: 0 % (ref 0.0–0.2)

## 2022-04-13 LAB — TROPONIN I (HIGH SENSITIVITY)
Troponin I (High Sensitivity): 4 ng/L (ref <18)
Troponin I (High Sensitivity): 4 ng/L (ref <18)

## 2022-04-13 MED ORDER — AZITHROMYCIN 250 MG PO TABS: ORAL_TABLET | ORAL | 0 refills | Status: AC

## 2022-04-13 MED ORDER — ALBUTEROL SULFATE HFA 108 (90 BASE) MCG/ACT IN AERS: 2.0000 | INHALATION_SPRAY | RESPIRATORY_TRACT | 1 refills | Status: AC | PRN

## 2022-04-13 MED ORDER — METHYLPREDNISOLONE SODIUM SUCC 125 MG IJ SOLR
125.0000 mg | Freq: Once | INTRAMUSCULAR | Status: AC
  Administered 2022-04-13: 125 mg via INTRAVENOUS
  Filled 2022-04-13: qty 2

## 2022-04-13 MED ORDER — IPRATROPIUM-ALBUTEROL 0.5-2.5 (3) MG/3ML IN SOLN
3.0000 mL | Freq: Once | RESPIRATORY_TRACT | Status: AC
  Administered 2022-04-13: 3 mL via RESPIRATORY_TRACT
  Filled 2022-04-13: qty 3

## 2022-04-13 MED ORDER — PREDNISONE 50 MG PO TABS: 50.0000 mg | ORAL_TABLET | Freq: Every day | ORAL | 0 refills | Status: AC

## 2022-04-13 MED ORDER — SODIUM CHLORIDE 0.9 % IV BOLUS
1000.0000 mL | Freq: Once | INTRAVENOUS | Status: AC
  Administered 2022-04-13: 1000 mL via INTRAVENOUS

## 2022-04-13 NOTE — Discharge Instructions (Addendum)
We evaluated you for your trouble breathing.  Your examination was concerning for COPD (emphysema).  Please follow-up with your primary doctor.  Please take your steroids for 5 days.  Please take your antibiotics as well for 5 days.  Use your inhaler as needed for trouble breathing.  Please continue to not smoke.

## 2022-04-13 NOTE — ED Provider Notes (Signed)
[facility] EMERGENCY DEPARTMENT Provider Note  CSN: [id number] Arrival date & time: 04/13/22 1605  Chief Complaint(s) Cough and Shortness of Breath  HPI Walter Reid is a 61 y.o. male without a formal diagnosis of emphysema but every day smoker who previously used inhalers presenting to the emergency department with shortness of breath.  Patient reports shortness of breath for the past 2 weeks, with associated cough intermittently productive of sputum.  He reports wheezing.  Denies fevers or chills.  Reports shortness of breath is worse with exertion.  No chest pain.  No syncope.  No nausea or vomiting.  No diaphoresis.  Symptoms are persistent.  He just quit smoking.   Past Medical History History reviewed. No pertinent past medical history. There are no problems to display for this patient.  Home Medication(s) Prior to Admission medications   Medication Sig Start Date End Date Taking? Authorizing Provider  azithromycin (ZITHROMAX Z-PAK) 250 MG tablet Take 2 tablets day one and 1 tablet days 2-5 once daily. 04/13/22  Yes Cristie Hem, MD  predniSONE (DELTASONE) 50 MG tablet Take 1 tablet (50 mg total) by mouth daily for 5 days. 04/13/22 04/18/22 Yes Cristie Hem, MD  albuterol (VENTOLIN HFA) 108 (90 Base) MCG/ACT inhaler Inhale 2 puffs into the lungs every 4 (four) hours as needed for wheezing or shortness of breath. 04/13/22   Cristie Hem, MD  cephALEXin (KEFLEX) 500 MG capsule Take 1 capsule (500 mg total) by mouth 4 (four) times daily. 05/17/14   Glendell Docker, NP  ibuprofen (ADVIL) 800 MG tablet Take 1 tablet (800 mg total) by mouth 3 (three) times daily. 03/28/19   Montine Circle, PA-C  methocarbamol (ROBAXIN) 500 MG tablet Take 1 tablet (500 mg total) by mouth 2 (two) times daily. 06/26/18   Ashley Murrain, NP  Ranitidine HCl (ZANTAC PO) Take 1 tablet by mouth once as needed (for reflux).    [provider]                                                                                                                                     Past Surgical History Past Surgical History:  Procedure Laterality Date   HERNIA REPAIR     Family History History reviewed. No pertinent family history.  Social History Social History   Tobacco Use   Smoking status: Every Day    Packs/day: 1.00    Years: 15.00    Total pack years: 15.00    Types: Cigarettes   Smokeless tobacco: Never  Vaping Use   Vaping Use: Never used  Substance Use Topics   Alcohol use: Yes   Drug use: No   Allergies Patient has no known allergies.  Review of Systems Review of Systems  All other systems reviewed and are negative.   Physical Exam Vital Signs  I have reviewed the triage vital signs BP (!) 177/104  Pulse (!) 51   Resp (!) 27   Ht '5\' 7"'$  (1.702 m)   Wt 86.2 kg   SpO2 94%   BMI 29.76 kg/m  Physical Exam Vitals and nursing note reviewed.  Constitutional:      General: He is not in acute distress.    Appearance: Normal appearance.  HENT:     Mouth/Throat:     Mouth: Mucous membranes are moist.  Eyes:     Conjunctiva/sclera: Conjunctivae normal.  Cardiovascular:     Rate and Rhythm: Normal rate and regular rhythm.  Pulmonary:     Effort: Pulmonary effort is normal. No respiratory distress.     Breath sounds: Examination of the right-upper field reveals wheezing. Examination of the left-upper field reveals wheezing. Examination of the right-middle field reveals wheezing. Examination of the left-middle field reveals wheezing. Examination of the right-lower field reveals wheezing. Examination of the left-lower field reveals wheezing. Wheezing present.  Abdominal:     General: Abdomen is flat.     Palpations: Abdomen is soft.     Tenderness: There is no abdominal tenderness.  Musculoskeletal:     Right lower leg: No edema.     Left lower leg: No edema.  Skin:    General: Skin is warm and dry.     Capillary Refill: Capillary  refill takes less than 2 seconds.  Neurological:     Mental Status: He is alert and oriented to person, place, and time. Mental status is at baseline.  Psychiatric:        Mood and Affect: Mood normal.        Behavior: Behavior normal.     ED Results and Treatments Labs (all labs ordered are listed, but only abnormal results are displayed) Labs Reviewed  BASIC METABOLIC PANEL - Abnormal; Notable for the following components:      Result Value   Glucose, Bld 115 (*)    All other components within normal limits  CBC  TROPONIN I (HIGH SENSITIVITY)  TROPONIN I (HIGH SENSITIVITY)                                                                                                                          Radiology DG Chest 2 View  Result Date: 04/13/2022 CLINICAL DATA:  Shortness of breath, cough EXAM: CHEST - 2 VIEW COMPARISON:  12/04/2021 FINDINGS: Heart is normal size. Right base atelectasis or scarring. No other confluent airspace opacities or effusions. No acute bony abnormality. IMPRESSION: Right basilar scarring or atelectasis.  No active disease. Electronically Signed   By: Rolm Baptise M.D.   On: 04/13/2022 17:09    Pertinent labs & imaging results that were available during my care of the patient were reviewed by me and considered in my medical decision making (see MDM for details).  Medications Ordered in ED Medications  methylPREDNISolone sodium succinate (SOLU-MEDROL) 125 mg/2 mL injection 125 mg (125 mg Intravenous Given 04/13/22 1933)  sodium chloride 0.9 % bolus 1,000 mL (  1,000 mLs Intravenous New Bag/Given 04/13/22 1931)  ipratropium-albuterol (DUONEB) 0.5-2.5 (3) MG/3ML nebulizer solution 3 mL (3 mLs Nebulization Given 04/13/22 1920)                                                                                                                                     Procedures Procedures  (including critical care time)  Medical Decision Making / ED  Course   MDM:  61 year old male presenting to the emergency department with shortness of breath.  Physical exam notable for diffuse wheezing.  Patient not in any respiratory distress.  Although patient has no formal diagnosis of COPD, he has been a long-term smoker and his presentation is consistent with COPD or asthma exacerbation.  Given age will treat empirically as COPD exacerbation.  Will give steroids, breathing treatments, antibiotics.  Patient not hypoxic, likely can be discharged after symptomatic improvement.  Low concern for alternative causes shortness of breath such as CHF, patient not volume overloaded appearing, doubt ACS, troponin negative, EKG not concerning.  Will reassess.  Clinical Course as of 04/13/22 2016  Nancy Fetter Apr 13, 2022  2011 Feels much better after breathing treatment.  Will prescribe inhaler, steroids, antibiotics.  Delta troponin negative. Will discharge patient to home. All questions answered. Patient comfortable with plan of discharge. Return precautions discussed with patient and specified on the after visit summary.  [WS]    Clinical Course User Index [WS] Cristie Hem, MD     Additional history obtained: -External records from outside source obtained and reviewed including: Chart review including previous notes, labs, imaging, consultation notes   Lab Tests: -I ordered, reviewed, and interpreted labs.   The pertinent results include:   Labs Reviewed  BASIC METABOLIC PANEL - Abnormal; Notable for the following components:      Result Value   Glucose, Bld 115 (*)    All other components within normal limits  CBC  TROPONIN I (HIGH SENSITIVITY)  TROPONIN I (HIGH SENSITIVITY)      EKG   EKG Interpretation  Date/Time:  Sunday April 13 2022 16:48:47 EDT Ventricular Rate:  52 PR Interval:  158 QRS Duration: 100 QT Interval:  450 QTC Calculation: 418 R Axis:   86 Text Interpretation: Sinus bradycardia Otherwise normal ECG Confirmed by  Garnette Gunner [phone]) on 04/13/2022 7:24:00 PM         Imaging Studies ordered: I ordered imaging studies including CXR On my interpretation imaging demonstrates no acute process I independently visualized and interpreted imaging. I agree with the radiologist interpretation   Medicines ordered and prescription drug management: Meds ordered this encounter  Medications   methylPREDNISolone sodium succinate (SOLU-MEDROL) 125 mg/2 mL injection 125 mg    IV methylprednisolone will be converted to either a q12h or q24h frequency with the same total daily dose (TDD).  Ordered Dose: 1 to 125 mg TDD; convert to: TDD q24h.  Ordered Dose: 126 to 250  mg TDD; convert to: TDD div q12h.  Ordered Dose: >250 mg TDD; DAW.   sodium chloride 0.9 % bolus 1,000 mL   ipratropium-albuterol (DUONEB) 0.5-2.5 (3) MG/3ML nebulizer solution 3 mL   albuterol (VENTOLIN HFA) 108 (90 Base) MCG/ACT inhaler    Sig: Inhale 2 puffs into the lungs every 4 (four) hours as needed for wheezing or shortness of breath.    Dispense:  1 each    Refill:  1   predniSONE (DELTASONE) 50 MG tablet    Sig: Take 1 tablet (50 mg total) by mouth daily for 5 days.    Dispense:  5 tablet    Refill:  0   azithromycin (ZITHROMAX Z-PAK) 250 MG tablet    Sig: Take 2 tablets day one and 1 tablet days 2-5 once daily.    Dispense:  6 tablet    Refill:  0    -I have reviewed the patients home medicines and have made adjustments as needed   Cardiac Monitoring: The patient was maintained on a cardiac monitor.  I personally viewed and interpreted the cardiac monitored which showed an underlying rhythm of: NSR  Social Determinants of Health:  Factors impacting patients care include: former smoker   Reevaluation: After the interventions noted above, I reevaluated the patient and found that they have improved  Co morbidities that complicate the patient evaluation History reviewed. No pertinent past medical history.     Dispostion: Discharge    Final Clinical Impression(s) / ED Diagnoses Final diagnoses:  COPD exacerbation ([place])     This chart was dictated using voice recognition software.  Despite best efforts to proofread,  errors can occur which can change the documentation meaning.    Cristie Hem, MD 04/13/22 2016

## 2022-04-13 NOTE — ED Triage Notes (Signed)
Intermittent productive cough and shob since yesterday. States his chest feels tight. States he quit smoking 2 days ago. Was smoking 1 pack per day.

## 2022-04-13 NOTE — ED Notes (Signed)
Presents with cough and chest tightness and some shortness of breath for the past two weeks, denies fevers, states has prod cough intermittently of sm amt of thick green sputum. Noted to be slightly DOE when ambulating to exam room.

## 2022-08-14 ENCOUNTER — Encounter (HOSPITAL_BASED_OUTPATIENT_CLINIC_OR_DEPARTMENT_OTHER): Payer: Self-pay | Admitting: Emergency Medicine

## 2022-08-14 ENCOUNTER — Emergency Department (HOSPITAL_BASED_OUTPATIENT_CLINIC_OR_DEPARTMENT_OTHER): Payer: Commercial Managed Care - HMO

## 2022-08-14 ENCOUNTER — Emergency Department (HOSPITAL_BASED_OUTPATIENT_CLINIC_OR_DEPARTMENT_OTHER)
Admission: EM | Admit: 2022-08-14 | Discharge: 2022-08-14 | Disposition: A | Discharge status: Home / Self Care | Payer: Commercial Managed Care - HMO | Attending: Emergency Medicine | Admitting: Emergency Medicine

## 2022-08-14 DIAGNOSIS — R059 Cough, unspecified: Secondary | ICD-10-CM

## 2022-08-14 DIAGNOSIS — Z20822 Contact with and (suspected) exposure to covid-19: Secondary | ICD-10-CM | POA: Diagnosis not present

## 2022-08-14 DIAGNOSIS — J101 Influenza due to other identified influenza virus with other respiratory manifestations: Secondary | ICD-10-CM | POA: Insufficient documentation

## 2022-08-14 LAB — RESP PANEL BY RT-PCR (RSV, FLU A&B, COVID)  RVPGX2
Influenza A by PCR: POSITIVE — AB
Influenza B by PCR: NEGATIVE
Resp Syncytial Virus by PCR: NEGATIVE
SARS Coronavirus 2 by RT PCR: NEGATIVE

## 2022-08-14 MED ORDER — ACETAMINOPHEN 500 MG PO TABS
1000.0000 mg | ORAL_TABLET | Freq: Once | ORAL | Status: AC
  Administered 2022-08-14: 1000 mg via ORAL
  Filled 2022-08-14: qty 2

## 2022-08-14 NOTE — ED Triage Notes (Signed)
Cough and Sob X 3 days

## 2022-08-14 NOTE — Discharge Instructions (Addendum)
Stay well-hydrated and use Tylenol or ibuprofen as needed every 6 hours for body aches, fevers or pain.  Return for breathing difficulty or new concerns.  You can try honey or tea with lemon for cough. Have repeat chest x-ray in a few weeks by primary care doctor.

## 2022-08-14 NOTE — ED Provider Notes (Signed)
[facility] Provider Note   CSN: [id number] Arrival date & time: 08/14/22  7989     History  Chief Complaint  Patient presents with   Cough   Shortness of Breath    Walter Reid is a 62 y.o. male.  Patient patient presents with recurrent cough congestion for almost 3 days.  Mild rib pain with coughing.  Intermittent fevers.  Close contact with similar symptoms.  No history of lung or heart disease.  No current shortness of breath.  No history of heart failure.  No leg swelling.  Patient tolerating oral liquids.       Home Medications Prior to Admission medications   Medication Sig Start Date End Date Taking? Authorizing Provider  albuterol (VENTOLIN HFA) 108 (90 Base) MCG/ACT inhaler Inhale 2 puffs into the lungs every 4 (four) hours as needed for wheezing or shortness of breath. 04/13/22   Cristie Hem, MD  azithromycin (ZITHROMAX Z-PAK) 250 MG tablet Take 2 tablets day one and 1 tablet days 2-5 once daily. 04/13/22   Cristie Hem, MD  cephALEXin (KEFLEX) 500 MG capsule Take 1 capsule (500 mg total) by mouth 4 (four) times daily. 05/17/14   Glendell Docker, NP  ibuprofen (ADVIL) 800 MG tablet Take 1 tablet (800 mg total) by mouth 3 (three) times daily. 03/28/19   Montine Circle, PA-C  methocarbamol (ROBAXIN) 500 MG tablet Take 1 tablet (500 mg total) by mouth 2 (two) times daily. 06/26/18   Ashley Murrain, NP  Ranitidine HCl (ZANTAC PO) Take 1 tablet by mouth once as needed (for reflux).    [provider]      Allergies    Patient has no known allergies.    Review of Systems   Review of Systems  Constitutional:  Negative for chills and fever.  HENT:  Positive for congestion.   Eyes:  Negative for visual disturbance.  Respiratory:  Positive for cough. Negative for shortness of breath.   Cardiovascular:  Negative for chest pain.  Gastrointestinal:  Negative for abdominal pain and vomiting.  Genitourinary:   Negative for dysuria and flank pain.  Musculoskeletal:  Negative for back pain, neck pain and neck stiffness.  Skin:  Negative for rash.  Neurological:  Negative for light-headedness and headaches.    Physical Exam Updated Vital Signs BP (!) 144/95 (BP Location: Left Arm)   Pulse 66   Temp 99.2 F (37.3 C) (Oral)   Resp 20   Ht '5\' 8"'$  (1.727 m)   Wt 86.2 kg   SpO2 96%   BMI 28.89 kg/m  Physical Exam Vitals and nursing note reviewed.  Constitutional:      General: He is not in acute distress.    Appearance: He is well-developed.  HENT:     Head: Normocephalic and atraumatic.     Comments: Nasal congestion    Mouth/Throat:     Mouth: Mucous membranes are moist.  Eyes:     General:        Right eye: No discharge.        Left eye: No discharge.     Conjunctiva/sclera: Conjunctivae normal.  Neck:     Trachea: No tracheal deviation.  Cardiovascular:     Rate and Rhythm: Normal rate and regular rhythm.     Heart sounds: No murmur heard. Pulmonary:     Effort: Pulmonary effort is normal.     Breath sounds: Normal breath sounds.  Abdominal:     General: There  is no distension.     Palpations: Abdomen is soft.     Tenderness: There is no abdominal tenderness. There is no guarding.  Musculoskeletal:     Cervical back: Normal range of motion and neck supple. No rigidity.  Skin:    General: Skin is warm.     Capillary Refill: Capillary refill takes less than 2 seconds.     Findings: No rash.  Neurological:     General: No focal deficit present.     Mental Status: He is alert.     Cranial Nerves: No cranial nerve deficit.  Psychiatric:        Mood and Affect: Mood normal.     ED Results / Procedures / Treatments   Labs (all labs ordered are listed, but only abnormal results are displayed) Labs Reviewed  RESP PANEL BY RT-PCR (RSV, FLU A&B, COVID)  RVPGX2 - Abnormal; Notable for the following components:      Result Value   Influenza A by PCR POSITIVE (*)    All  other components within normal limits    EKG None  Radiology DG Chest 2 View  Result Date: 08/14/2022 CLINICAL DATA:  Cough, shortness of breath EXAM: CHEST - 2 VIEW COMPARISON:  04/13/2022 FINDINGS: Cardiac size is within normal limits. There are no signs of pulmonary edema. There is interval decrease in patchy infiltrate in right lower lung field. No new focal infiltrates are seen. There is pleural thickening in the lateral aspect of right upper lung field. There is no pleural effusion or pneumothorax. IMPRESSION: There is interval improvement in the aeration in right lower lung fields, possibly suggesting decrease in atelectasis/pneumonia. No new focal infiltrates are seen. Electronically Signed   By: Elmer Picker M.D.   On: 08/14/2022 08:07    Procedures Procedures    Medications Ordered in ED Medications  acetaminophen (TYLENOL) tablet 1,000 mg (1,000 mg Oral Given 08/14/22 0736)    ED Course/ Medical Decision Making/ A&P                           Medical Decision Making Amount and/or Complexity of Data Reviewed Radiology: ordered.  Risk OTC drugs.   Patient presents with clinical concern for acute bronchitis versus viral syndrome.  Patient is overall well-appearing, mild elevated blood pressure, normal oxygenation in the room reviewed, afebrile.  Tylenol given for mild bilateral rib pain and bodyaches.  Chest x-ray ordered to look for any infiltrate.  Discussed likely viral process such as influenza, no signs of congestive heart failure or other more significant pathology at this time.  Viral testing sent for outpatient follow-up.  Patient comfortable this plan.        Final Clinical Impression(s) / ED Diagnoses Final diagnoses:  Influenza A  Cough in adult    Rx / DC Orders ED Discharge Orders     None         Elnora Morrison, MD 08/14/22 [id number]

## 2022-08-14 NOTE — ED Notes (Signed)
ED Provider at bedside. 
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
## 2023-10-13 ENCOUNTER — Emergency Department (HOSPITAL_COMMUNITY): Payer: Self-pay

## 2023-10-13 ENCOUNTER — Other Ambulatory Visit: Payer: Self-pay

## 2023-10-13 ENCOUNTER — Emergency Department (HOSPITAL_COMMUNITY)
Admission: EM | Admit: 2023-10-13 | Discharge: 2023-10-13 | Disposition: A | Discharge status: Home / Self Care | Payer: Self-pay | Attending: Emergency Medicine | Admitting: Emergency Medicine

## 2023-10-13 ENCOUNTER — Encounter (HOSPITAL_COMMUNITY): Payer: Self-pay

## 2023-10-13 DIAGNOSIS — S82832A Other fracture of upper and lower end of left fibula, initial encounter for closed fracture: Secondary | ICD-10-CM | POA: Diagnosis not present

## 2023-10-13 DIAGNOSIS — Y990 Civilian activity done for income or pay: Secondary | ICD-10-CM | POA: Diagnosis not present

## 2023-10-13 DIAGNOSIS — W228XXA Striking against or struck by other objects, initial encounter: Secondary | ICD-10-CM | POA: Insufficient documentation

## 2023-10-13 DIAGNOSIS — M79605 Pain in left leg: Secondary | ICD-10-CM | POA: Diagnosis present

## 2023-10-13 DIAGNOSIS — S8002XA Contusion of left knee, initial encounter: Secondary | ICD-10-CM

## 2023-10-13 LAB — BASIC METABOLIC PANEL
Anion gap: 8 (ref 5–15)
BUN: 10 mg/dL (ref 8–23)
CO2: 24 mmol/L (ref 22–32)
Calcium: 8.7 mg/dL — ABNORMAL LOW (ref 8.9–10.3)
Chloride: 101 mmol/L (ref 98–111)
Creatinine, Ser: 0.79 mg/dL (ref 0.61–1.24)
GFR, Estimated: >60 mL/min (ref >60)
Glucose, Bld: 117 mg/dL — ABNORMAL HIGH (ref 70–99)
Potassium: 4.1 mmol/L (ref 3.5–5.1)
Sodium: 133 mmol/L — ABNORMAL LOW (ref 135–145)

## 2023-10-13 LAB — CBC WITH DIFFERENTIAL/PLATELET
Abs Immature Granulocytes: 0.03 10*3/uL (ref 0.00–0.07)
Basophils Absolute: 0 10*3/uL (ref 0.0–0.1)
Basophils Relative: 0 %
Eosinophils Absolute: 0.1 10*3/uL (ref 0.0–0.5)
Eosinophils Relative: 1 %
HCT: 47.3 % (ref 39.0–52.0)
Hemoglobin: 15.4 g/dL (ref 13.0–17.0)
Immature Granulocytes: 0 %
Lymphocytes Relative: 22 %
Lymphs Abs: 2.1 10*3/uL (ref 0.7–4.0)
MCH: 30.4 pg (ref 26.0–34.0)
MCHC: 32.6 g/dL (ref 30.0–36.0)
MCV: 93.3 fL (ref 80.0–100.0)
Monocytes Absolute: 0.9 10*3/uL (ref 0.1–1.0)
Monocytes Relative: 9 %
Neutro Abs: 6.6 10*3/uL (ref 1.7–7.7)
Neutrophils Relative %: 68 %
Platelets: 267 10*3/uL (ref 150–400)
RBC: 5.07 MIL/uL (ref 4.22–5.81)
RDW: 14.2 % (ref 11.5–15.5)
WBC: 9.8 10*3/uL (ref 4.0–10.5)
nRBC: 0 % (ref 0.0–0.2)

## 2023-10-13 LAB — CK: Total CK: 894 U/L — ABNORMAL HIGH (ref 49–397)

## 2023-10-13 MED ORDER — NAPROXEN 250 MG PO TABS
500.0000 mg | ORAL_TABLET | Freq: Once | ORAL | Status: AC
Start: 2023-10-13 — End: 2023-10-13
  Administered 2023-10-13: 500 mg via ORAL
  Filled 2023-10-13: qty 2

## 2023-10-13 MED ORDER — NAPROXEN 500 MG PO TABS: 500.0000 mg | ORAL_TABLET | Freq: Two times a day (BID) | ORAL | 0 refills | Status: AC

## 2023-10-13 MED ORDER — OXYCODONE-ACETAMINOPHEN 5-325 MG PO TABS
1.0000 | ORAL_TABLET | Freq: Once | ORAL | Status: AC
  Administered 2023-10-13: 1 via ORAL
  Filled 2023-10-13: qty 1

## 2023-10-13 MED ORDER — OXYCODONE-ACETAMINOPHEN 5-325 MG PO TABS: 1.0000 | ORAL_TABLET | Freq: Four times a day (QID) | ORAL | 0 refills | Status: AC | PRN

## 2023-10-13 NOTE — ED Provider Notes (Signed)
 [place] EMERGENCY DEPARTMENT AT [facility] Provider Note   CSN: [id number] Arrival date & time: 10/13/23  1055     History  Chief Complaint  Patient presents with   Leg Injury    Walter Reid is a 63 y.o. male.  Patient is a 63 year old male who presents emergency department the chief complaint of pain to the left knee and left tib-fib region.  Patient notes that he was cutting a tree earlier today when it swung back striking him on along the medial aspect of the knee and tib-fib region.  Patient notes that he is experiencing moderate pain at this time.  He denies any numbness or paresthesias distally.  He denies any other long bone or joint pain.        Home Medications Prior to Admission medications   Medication Sig Start Date End Date Taking? Authorizing Provider  albuterol (VENTOLIN HFA) 108 (90 Base) MCG/ACT inhaler Inhale 2 puffs into the lungs every 4 (four) hours as needed for wheezing or shortness of breath. 04/13/22   Lonell Grandchild, MD  azithromycin (ZITHROMAX Z-PAK) 250 MG tablet Take 2 tablets day one and 1 tablet days 2-5 once daily. 04/13/22   Lonell Grandchild, MD  cephALEXin (KEFLEX) 500 MG capsule Take 1 capsule (500 mg total) by mouth 4 (four) times daily. 05/17/14   Teressa Lower, NP  ibuprofen (ADVIL) 800 MG tablet Take 1 tablet (800 mg total) by mouth 3 (three) times daily. 03/28/19   Roxy Horseman, PA-C  methocarbamol (ROBAXIN) 500 MG tablet Take 1 tablet (500 mg total) by mouth 2 (two) times daily. 06/26/18   Janne Napoleon, NP  Ranitidine HCl (ZANTAC PO) Take 1 tablet by mouth once as needed (for reflux).    [provider]      Allergies    Patient has no known allergies.    Review of Systems   Review of Systems  Musculoskeletal:        Left knee pain and swelling  All other systems reviewed and are negative.   Physical Exam Updated Vital Signs BP (!) 169/87 (BP Location: Right Arm)   Pulse 60   Temp 98.7  F (37.1 C) (Oral)   Resp 16   Ht 5\' 8"  (1.727 m)   Wt 77.1 kg   SpO2 100%   BMI 25.85 kg/m  Physical Exam Vitals and nursing note reviewed.  Constitutional:      Appearance: Normal appearance.  HENT:     Head: Normocephalic and atraumatic.  Eyes:     Extraocular Movements: Extraocular movements intact.     Conjunctiva/sclera: Conjunctivae normal.     Pupils: Pupils are equal, round, and reactive to light.  Cardiovascular:     Rate and Rhythm: Normal rate and regular rhythm.     Pulses: Normal pulses.     Heart sounds: Normal heart sounds.  Pulmonary:     Effort: Pulmonary effort is normal.     Breath sounds: Normal breath sounds.  Musculoskeletal:     Cervical back: Normal range of motion and neck supple.     Comments: Tenderness palpation noted over the medial aspect of the left knee and left tib-fib region, nontender palpation over left ankle, foot, hip, DP and PT pulses are 2+ distally, sensation is intact distally, full range of motion is intact throughout, moderate edema is noted over the left knee and left tib-fib region, abrasions noted over the medial aspect of the lower leg, no obvious deformity,  no skin breakdown or ulceration, no lacerations  Skin:    General: Skin is warm and dry.     Findings: Bruising present.  Neurological:     General: No focal deficit present.     Mental Status: He is alert and oriented to person, place, and time. Mental status is at baseline.     Motor: No weakness.  Psychiatric:        Mood and Affect: Mood normal.        Behavior: Behavior normal.        Thought Content: Thought content normal.        Judgment: Judgment normal.     ED Results / Procedures / Treatments   Labs (all labs ordered are listed, but only abnormal results are displayed) Labs Reviewed  BASIC METABOLIC PANEL - Abnormal; Notable for the following components:      Result Value   Sodium 133 (*)    Glucose, Bld 117 (*)    Calcium 8.7 (*)    All other  components within normal limits  CK - Abnormal; Notable for the following components:   Total CK 894 (*)    All other components within normal limits  CBC WITH DIFFERENTIAL/PLATELET    EKG None  Radiology DG Knee Complete 4 Views Left Result Date: 10/13/2023 CLINICAL DATA:  Pain after injury. EXAM: LEFT KNEE - COMPLETE 4+ VIEW; LEFT TIBIA AND FIBULA - 2 VIEW COMPARISON:  None Available. FINDINGS: Knee: No fracture or dislocation. The alignment is normal. Joint spaces are preserved. No significant knee joint effusion. No arthropathy or focal bone abnormality. Tibia/fibula: No acute fracture. The cortical margins of the tibia and fibula are intact. Ankle alignment is maintained. No focal bone abnormality. Mild soft tissue edema IMPRESSION: Mild soft tissue edema. No fracture or subluxation of the knee or tibia/fibula. Electronically Signed   By: Narda Rutherford M.D.   On: 10/13/2023 15:03   DG Tibia/Fibula Left Result Date: 10/13/2023 CLINICAL DATA:  Pain after injury. EXAM: LEFT KNEE - COMPLETE 4+ VIEW; LEFT TIBIA AND FIBULA - 2 VIEW COMPARISON:  None Available. FINDINGS: Knee: No fracture or dislocation. The alignment is normal. Joint spaces are preserved. No significant knee joint effusion. No arthropathy or focal bone abnormality. Tibia/fibula: No acute fracture. The cortical margins of the tibia and fibula are intact. Ankle alignment is maintained. No focal bone abnormality. Mild soft tissue edema IMPRESSION: Mild soft tissue edema. No fracture or subluxation of the knee or tibia/fibula. Electronically Signed   By: Narda Rutherford M.D.   On: 10/13/2023 15:03    Procedures Procedures    Medications Ordered in ED Medications  oxyCODONE-acetaminophen (PERCOCET/ROXICET) 5-325 MG per tablet 1 tablet (1 tablet Oral Given 10/13/23 2035)    ED Course/ Medical Decision Making/ A&P                                 Medical Decision Making Amount and/or Complexity of Data Reviewed Labs:  ordered. Radiology: ordered.  Risk Prescription drug management.   This patient presents to the ED for concern of left knee pain differential diagnosis includes femur, tibia, fibula fracture, contusion, ligamentous injury, sprain, strain, compartment syndrome    Additional history obtained:  Additional history obtained from none External records from outside source obtained and reviewed including none   Lab Tests:  I Ordered, and personally interpreted labs.  The pertinent results include: Elevated creatinine kinase   Imaging  Studies ordered:  I ordered imaging studies including CT scan of the left knee, x-ray of knee and tib-fib I independently visualized and interpreted imaging which showed proximal chip fracture of the fibula, edema, no fracture to distal tibia or fibula I agree with the radiologist interpretation   Medicines ordered and prescription drug management:  I ordered medication including Percocet, naproxen for pain Reevaluation of the patient after these medicines showed that the patient improved I have reviewed the patients home medicines and have made adjustments as needed   Problem List / ED Course:  Patient is doing well at this time and is stable for discharge home.  Patient's pain has greatly improved with treatment in the emergency department.  X-ray did demonstrate a proximal left fibula chip fracture.  Patient is completely neurovascularly intact distally and has soft compartments throughout the left lower extremity.  Patient has no indication for compartment syndrome at this point.  Do not suspect underlying arterial insufficiency.  Patient will be placed in a knee immobilizer and crutches and was directed to remain nonweightbearing on the left lower extremity until evaluated by orthopedics.  Did discuss with patient that we cannot fully rule out underlying ligamentous or tendon injury.  He has no indication for patellar tendon injury on exam.  Will  continue treatment of his pain on outpatient basis as well.  Stressed the importance of continuing to elevate his left leg and to continue to place ice over the leg.  Strict turn precautions were discussed for any new or worsening symptoms.  Signs of compartment syndrome were discussed to look for at home though again patient has no clinical indication for compartment syndrome at this time.  Patient voiced understanding to the plan and had no additional questions. Patient was fully evaluated by attending physician who is in agreement to plan at this time.    Social Determinants of Health:  None           Final Clinical Impression(s) / ED Diagnoses Final diagnoses:  None    Rx / DC Orders ED Discharge Orders     None         Kathlen Mody 10/13/23 2307    Derwood Kaplan, MD 10/17/23 [phone]

## 2023-10-13 NOTE — Discharge Instructions (Signed)
 Please continue to wear the new immobilizer and utilize the crutches until evaluated by orthopedics.  Please do not place any weight on your left leg until evaluated by orthopedics.  Return to the emergency department immediately for any new or worsening symptoms to include worsening pain, numbness or tingling, color changes to your leg.

## 2023-10-13 NOTE — ED Triage Notes (Signed)
 Pt arrived via POV c/o left leg pain. Pt reports he was cutting a tree limb and the tree limb fell, crashing into his inner left leg. Pt presents in Triage with limited mobility in LLE.

## 2023-10-20 ENCOUNTER — Ambulatory Visit: Admitting: Orthopaedic Surgery

## 2023-10-21 NOTE — Progress Notes (Unsigned)
   Office Visit Note   Patient: Walter Reid           Date of Birth: 1961/02/20           MRN: [id number] Visit Date: 10/22/2023              Requested by: No referring provider defined for this encounter. PCP: Patient, No Pcp Per   Assessment & Plan: Visit Diagnoses: No diagnosis found.  Plan: ***  Follow-Up Instructions: No follow-ups on file.   Orders:  No orders of the defined types were placed in this encounter. No orders of the defined types were placed in this encounter.    Procedures: No procedures performed   Clinical Data: No additional findings.   Subjective: No chief complaint on file.  HPI  Review of Systems   Objective: Vital Signs: There were no vitals taken for this visit.  Physical Exam  Ortho Exam  Specialty Comments:  No specialty comments available.  Imaging: No results found.   PMFS History: There are no active problems to display for this patient. No past medical history on file.  No family history on file.  Past Surgical History:  Procedure Laterality Date  . HERNIA REPAIR     Social History   Occupational History  . Not on file  Tobacco Use  . Smoking status: Every Day    Current packs/day: 1.00    Average packs/day: 1 pack/day for 15.0 years (15.0 ttl pk-yrs)    Types: Cigarettes  . Smokeless tobacco: Never  Vaping Use  . Vaping status: Never Used  Substance and Sexual Activity  . Alcohol use: Yes  . Drug use: No  . Sexual activity: Yes

## 2023-10-22 ENCOUNTER — Ambulatory Visit (INDEPENDENT_AMBULATORY_CARE_PROVIDER_SITE_OTHER): Admitting: Orthopaedic Surgery

## 2023-10-22 DIAGNOSIS — S82832A Other fracture of upper and lower end of left fibula, initial encounter for closed fracture: Secondary | ICD-10-CM | POA: Insufficient documentation

## 2023-10-22 MED ORDER — HYDROCODONE-ACETAMINOPHEN 5-325 MG PO TABS: 1.0000 | ORAL_TABLET | Freq: Every day | ORAL | 0 refills | Status: AC | PRN

## 2024-03-04 IMAGING — CR DG CHEST 2V
2 series · 2 of 2 positions shown · non-contrast
Comparison: 03/28/2019

CLINICAL DATA: Shortness of breath. Pain across the chest for 2
weeks with cough and congestion

EXAM:
CHEST - 2 VIEW

[w chest pa]
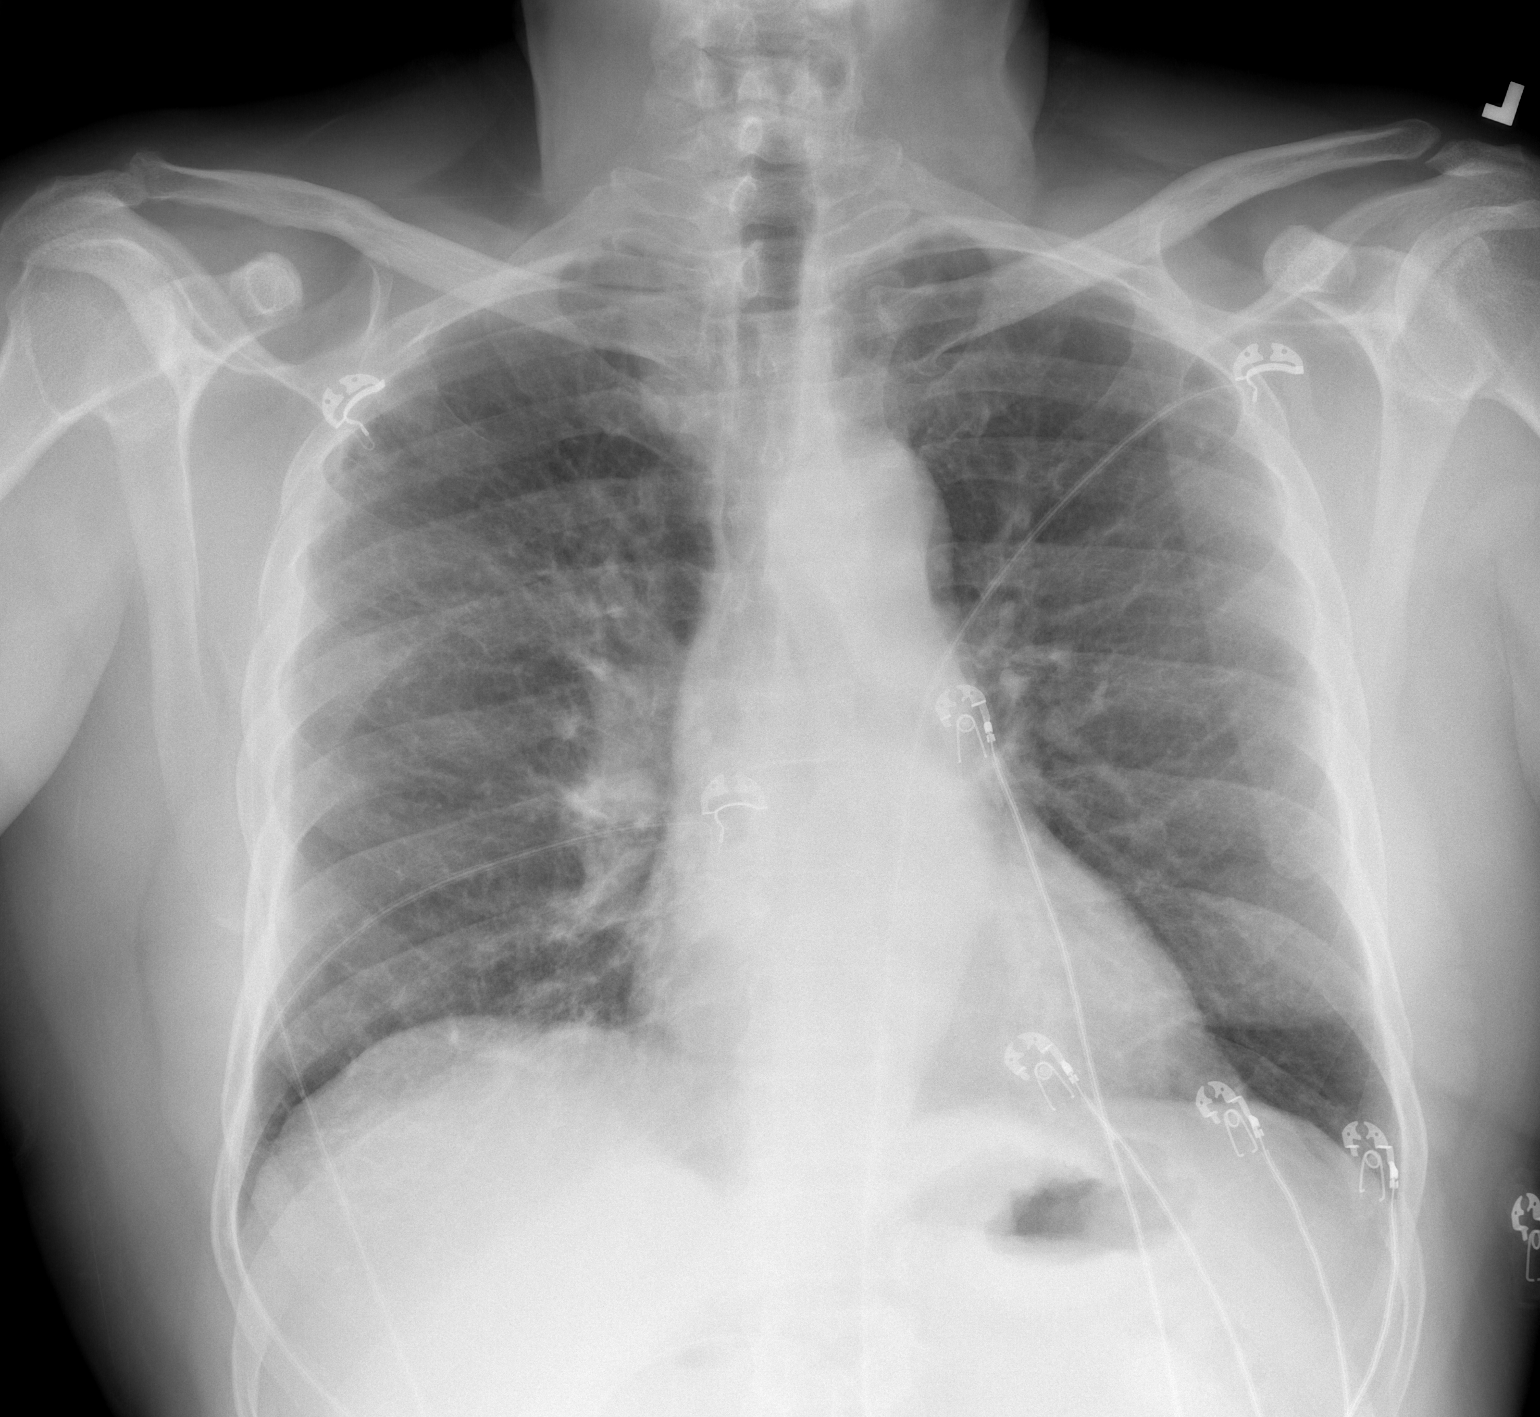

[w chest lat]
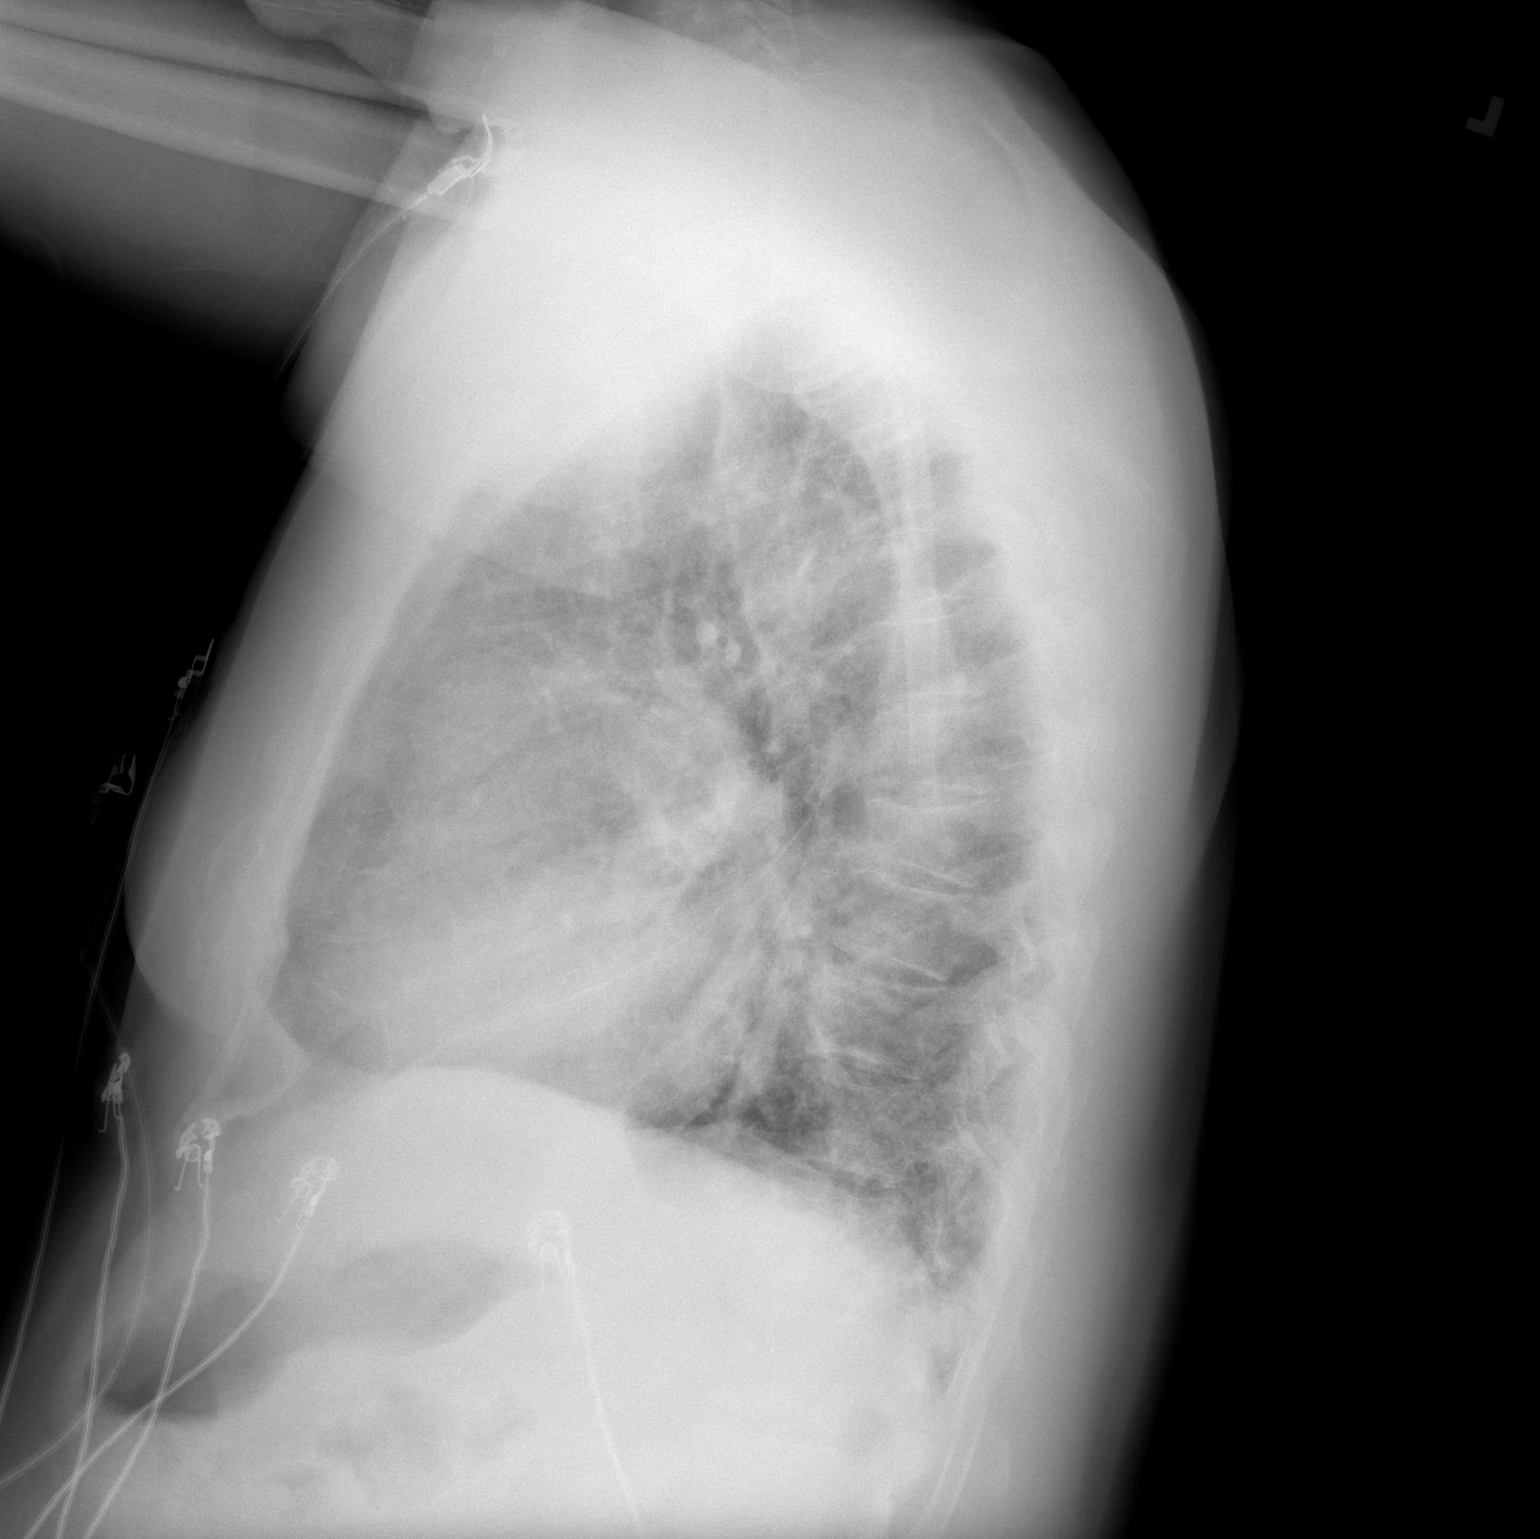

[2 of 2 positions shown; findings below may reference images not displayed]

FINDINGS: Interstitial coarsening which is chronic and may be smoking related
given the chart history instability. There is no edema,
consolidation, effusion, or pneumothorax. Normal heart size and
mediastinal contours. Chronic midthoracic vertebral wedging, also
seen in 1111.
IMPRESSION: Chronic bronchitic markings. No acute finding when compared to
prior.
